# Patient Record
Sex: Female | Born: 1945 | Hispanic: No | Marital: Single | Smoking: Never smoker
Health system: Southern US, Community
[De-identification: ages and names within clinical notes are randomized; demographics above are authoritative.]

## PROBLEM LIST (undated history)

## (undated) DIAGNOSIS — E119 Type 2 diabetes mellitus without complications: Secondary | ICD-10-CM

---

## 2017-10-02 ENCOUNTER — Emergency Department (HOSPITAL_COMMUNITY): Admission: EM | Admit: 2017-10-02 | Discharge: 2017-10-02 | Discharge status: Against Medical Advice | Payer: Self-pay

## 2017-10-02 ENCOUNTER — Other Ambulatory Visit: Payer: Self-pay

## 2017-10-03 ENCOUNTER — Emergency Department (HOSPITAL_COMMUNITY): Payer: Self-pay

## 2017-10-03 ENCOUNTER — Other Ambulatory Visit: Payer: Self-pay

## 2017-10-03 ENCOUNTER — Inpatient Hospital Stay (HOSPITAL_COMMUNITY)
Admission: EM | Admit: 2017-10-03 | Discharge: 2017-10-06 | DRG: 638 | Disposition: A | Discharge status: Home / Self Care | Payer: Self-pay | Attending: Internal Medicine | Admitting: Internal Medicine

## 2017-10-03 ENCOUNTER — Encounter (HOSPITAL_COMMUNITY): Payer: Self-pay | Admitting: Emergency Medicine

## 2017-10-03 DIAGNOSIS — E871 Hypo-osmolality and hyponatremia: Secondary | ICD-10-CM | POA: Diagnosis present

## 2017-10-03 DIAGNOSIS — E119 Type 2 diabetes mellitus without complications: Secondary | ICD-10-CM

## 2017-10-03 DIAGNOSIS — E860 Dehydration: Secondary | ICD-10-CM | POA: Diagnosis present

## 2017-10-03 DIAGNOSIS — E1110 Type 2 diabetes mellitus with ketoacidosis without coma: Principal | ICD-10-CM | POA: Diagnosis present

## 2017-10-03 DIAGNOSIS — L97509 Non-pressure chronic ulcer of other part of unspecified foot with unspecified severity: Secondary | ICD-10-CM

## 2017-10-03 DIAGNOSIS — I10 Essential (primary) hypertension: Secondary | ICD-10-CM | POA: Diagnosis present

## 2017-10-03 DIAGNOSIS — R739 Hyperglycemia, unspecified: Secondary | ICD-10-CM | POA: Diagnosis present

## 2017-10-03 DIAGNOSIS — L03116 Cellulitis of left lower limb: Secondary | ICD-10-CM | POA: Diagnosis present

## 2017-10-03 DIAGNOSIS — E11621 Type 2 diabetes mellitus with foot ulcer: Secondary | ICD-10-CM | POA: Diagnosis present

## 2017-10-03 DIAGNOSIS — Z9114 Patient's other noncompliance with medication regimen: Secondary | ICD-10-CM

## 2017-10-03 DIAGNOSIS — L97529 Non-pressure chronic ulcer of other part of left foot with unspecified severity: Secondary | ICD-10-CM | POA: Diagnosis present

## 2017-10-03 DIAGNOSIS — E11610 Type 2 diabetes mellitus with diabetic neuropathic arthropathy: Secondary | ICD-10-CM | POA: Diagnosis present

## 2017-10-03 DIAGNOSIS — E1142 Type 2 diabetes mellitus with diabetic polyneuropathy: Secondary | ICD-10-CM | POA: Diagnosis present

## 2017-10-03 HISTORY — DX: Type 2 diabetes mellitus without complications: E11.9

## 2017-10-03 LAB — COMPREHENSIVE METABOLIC PANEL
ALBUMIN: 3.3 g/dL — AB (ref 3.5–5.0)
ALK PHOS: 121 U/L (ref 38–126)
ALT: 17 U/L (ref 14–54)
AST: 23 U/L (ref 15–41)
Anion gap: 11 (ref 5–15)
BUN: 18 mg/dL (ref 6–20)
CALCIUM: 9.5 mg/dL (ref 8.9–10.3)
CHLORIDE: 93 mmol/L — AB (ref 101–111)
CO2: 23 mmol/L (ref 22–32)
CREATININE: 0.8 mg/dL (ref 0.44–1.00)
GFR calc non Af Amer: >60 mL/min (ref >60)
GLUCOSE: 568 mg/dL — AB (ref 65–99)
Potassium: 5 mmol/L (ref 3.5–5.1)
SODIUM: 127 mmol/L — AB (ref 135–145)
Total Bilirubin: 0.9 mg/dL (ref 0.3–1.2)
Total Protein: 7.5 g/dL (ref 6.5–8.1)

## 2017-10-03 LAB — URINALYSIS, ROUTINE W REFLEX MICROSCOPIC
Bilirubin Urine: NEGATIVE
Ketones, ur: NEGATIVE mg/dL
NITRITE: NEGATIVE
PROTEIN: NEGATIVE mg/dL
Specific Gravity, Urine: 1.021 (ref 1.005–1.030)
pH: 6 (ref 5.0–8.0)

## 2017-10-03 LAB — CBC WITH DIFFERENTIAL/PLATELET
BASOS PCT: 1 %
Basophils Absolute: 0 10*3/uL (ref 0.0–0.1)
EOS ABS: 0.1 10*3/uL (ref 0.0–0.7)
Eosinophils Relative: 2 %
HCT: 40.8 % (ref 36.0–46.0)
HEMOGLOBIN: 14.7 g/dL (ref 12.0–15.0)
Lymphocytes Relative: 26 %
Lymphs Abs: 1.6 10*3/uL (ref 0.7–4.0)
MCH: 31.6 pg (ref 26.0–34.0)
MCHC: 36 g/dL (ref 30.0–36.0)
MCV: 87.7 fL (ref 78.0–100.0)
MONO ABS: 0.4 10*3/uL (ref 0.1–1.0)
MONOS PCT: 6 %
NEUTROS PCT: 65 %
Neutro Abs: 4 10*3/uL (ref 1.7–7.7)
Platelets: 194 10*3/uL (ref 150–400)
RBC: 4.65 MIL/uL (ref 3.87–5.11)
RDW: 12.1 % (ref 11.5–15.5)
WBC: 6.2 10*3/uL (ref 4.0–10.5)

## 2017-10-03 LAB — HEMOGLOBIN A1C
Hgb A1c MFr Bld: 12.7 % — ABNORMAL HIGH (ref 4.8–5.6)
MEAN PLASMA GLUCOSE: 317.79 mg/dL

## 2017-10-03 LAB — I-STAT CG4 LACTIC ACID, ED: Lactic Acid, Venous: 2.15 mmol/L (ref 0.5–1.9)

## 2017-10-03 LAB — GLUCOSE, CAPILLARY: Glucose-Capillary: 135 mg/dL — ABNORMAL HIGH (ref 65–99)

## 2017-10-03 LAB — LACTIC ACID, PLASMA: Lactic Acid, Venous: 1.2 mmol/L (ref 0.5–1.9)

## 2017-10-03 LAB — CBG MONITORING, ED
GLUCOSE-CAPILLARY: 548 mg/dL — AB (ref 65–99)
GLUCOSE-CAPILLARY: 336 mg/dL — AB (ref 65–99)
Glucose-Capillary: 180 mg/dL — ABNORMAL HIGH (ref 65–99)
Glucose-Capillary: 238 mg/dL — ABNORMAL HIGH (ref 65–99)

## 2017-10-03 MED ORDER — LISINOPRIL 2.5 MG PO TABS
2.5000 mg | ORAL_TABLET | Freq: Every day | ORAL | Status: DC
  Administered 2017-10-03 – 2017-10-06 (×4): 2.5 mg via ORAL
  Filled 2017-10-03 (×4): qty 1

## 2017-10-03 MED ORDER — SODIUM CHLORIDE 0.9 % IV SOLN: INTRAVENOUS | Status: DC

## 2017-10-03 MED ORDER — POTASSIUM CHLORIDE 10 MEQ/100ML IV SOLN: 10.0000 meq | INTRAVENOUS | Status: DC

## 2017-10-03 MED ORDER — INSULIN ASPART 100 UNIT/ML ~~LOC~~ SOLN
0.0000 [IU] | Freq: Three times a day (TID) | SUBCUTANEOUS | Status: DC
  Administered 2017-10-03 – 2017-10-04 (×2): 3 [IU] via SUBCUTANEOUS
  Administered 2017-10-04: 5 [IU] via SUBCUTANEOUS
  Administered 2017-10-04: 8 [IU] via SUBCUTANEOUS
  Administered 2017-10-05: 15 [IU] via SUBCUTANEOUS
  Administered 2017-10-05 – 2017-10-06 (×3): 8 [IU] via SUBCUTANEOUS
  Filled 2017-10-03: qty 1

## 2017-10-03 MED ORDER — DEXTROSE-NACL 5-0.45 % IV SOLN: INTRAVENOUS | Status: DC

## 2017-10-03 MED ORDER — INSULIN REGULAR HUMAN 100 UNIT/ML IJ SOLN
INTRAMUSCULAR | Status: DC
  Filled 2017-10-03: qty 1

## 2017-10-03 MED ORDER — ENOXAPARIN SODIUM 40 MG/0.4ML ~~LOC~~ SOLN
40.0000 mg | SUBCUTANEOUS | Status: DC
  Administered 2017-10-03 – 2017-10-05 (×3): 40 mg via SUBCUTANEOUS
  Filled 2017-10-03 (×3): qty 0.4

## 2017-10-03 MED ORDER — SODIUM CHLORIDE 0.9 % IV SOLN
INTRAVENOUS | Status: AC
  Administered 2017-10-03 – 2017-10-04 (×2): via INTRAVENOUS

## 2017-10-03 MED ORDER — SODIUM CHLORIDE 0.9 % IV SOLN
INTRAVENOUS | Status: DC
  Filled 2017-10-03: qty 1

## 2017-10-03 MED ORDER — INSULIN ASPART 100 UNIT/ML ~~LOC~~ SOLN
20.0000 [IU] | Freq: Once | SUBCUTANEOUS | Status: AC
  Administered 2017-10-03: 20 [IU] via SUBCUTANEOUS
  Filled 2017-10-03: qty 1

## 2017-10-03 MED ORDER — DEXTROSE 50 % IV SOLN: 25.0000 mL | INTRAVENOUS | Status: DC | PRN

## 2017-10-03 MED ORDER — INSULIN ASPART 100 UNIT/ML ~~LOC~~ SOLN
0.0000 [IU] | Freq: Every day | SUBCUTANEOUS | Status: DC
  Administered 2017-10-04: 3 [IU] via SUBCUTANEOUS
  Administered 2017-10-05: 4 [IU] via SUBCUTANEOUS

## 2017-10-03 NOTE — ED Triage Notes (Signed)
Patient has diabetes, open wound on bottom of left foot. Patient states she only feels the pain from the wound sometimes. 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 No other complaints at this time.

## 2017-10-03 NOTE — ED Notes (Signed)
2 lines started and NS running in wide open

## 2017-10-03 NOTE — ED Notes (Signed)
Patient transported to X-ray 

## 2017-10-03 NOTE — H&P (Signed)
History and Physical    Stacey Duffy ZOX:[id number] DOB: 01-16-1946 DOA: 10/03/2017  PCP: Patient, No Pcp Per Patient coming from: home  Chief Complaint: generalized weakness  HPI: Stacey Duffy is a 72 y.o. female with medical history significant diabetes presents to the emergency Department chief complaint ongoing generalized weakness intermittent dizziness. Initial evaluation reveals hyperglycemia. [facility] are asked to admit  Information is obtained from the grandson who translates for the patient. Of note grandson seems to have little information and not completely willing to ask all questions during exam. He reports patient came to this country 5 months ago. Patient knows she is a diabetic and at home she took pills. She brought no medicines with her to this country. The last couple of days she has experienced generalized weakness intermittent dizziness. She denies headache syncope or near-syncope. She denies chest pain palpitation shortness of breath. She denies nausea vomiting diarrhea constipation bright red blood per rectum or melena. She denies dysuria hematuria frequency or urgency. She does report she has an ongoing nonhealing foot ulcer at the bottom of her left foot. She denies a fever chills.   ED Course: in the emergency department she is afebrile hemodynamically stable and not hypoxic. He is provided with IV fluids  Review of Systems: As per HPI otherwise all other systems reviewed and are negative.   Ambulatory Status:ambulates independently  Past Medical History:  Diagnosis Date  . Diabetes mellitus without complication (HCC)     History reviewed. No pertinent surgical history.  Social History   Socioeconomic History  . Marital status: Single    Spouse name: Not on file  . Number of children: Not on file  . Years of education: Not on file  . Highest education level: Not on file  Occupational History  . Not on file  Social Needs  . Financial  resource strain: Not on file  . Food insecurity:    Worry: Not on file    Inability: Not on file  . Transportation needs:    Medical: Not on file    Non-medical: Not on file  Tobacco Use  . Smoking status: Not on file  Substance and Sexual Activity  . Alcohol use: Not on file  . Drug use: Not on file  . Sexual activity: Not on file  Lifestyle  . Physical activity:    Days per week: Not on file    Minutes per session: Not on file  . Stress: Not on file  Relationships  . Social connections:    Talks on phone: Not on file    Gets together: Not on file    Attends religious service: Not on file    Active member of club or organization: Not on file    Attends meetings of clubs or organizations: Not on file    Relationship status: Not on file  . Intimate partner violence:    Fear of current or ex partner: Not on file    Emotionally abused: Not on file    Physically abused: Not on file    Forced sexual activity: Not on file  Other Topics Concern  . Not on file  Social History Narrative  . Not on file    No Known Allergies  No family history on file. unable to determine  Prior to Admission medications   Medication Sig Start Date End Date Taking? Authorizing Provider  Famotidine (ACID CONTROLLER PO) Take 1 tablet by mouth daily.   Yes [provider]  Physical Exam: Vitals:   10/03/17 1430 10/03/17 1500 10/03/17 1600 10/03/17 1615  BP: (!) 143/73 (!) 151/71 (!) 159/79 (!) 146/81  Pulse: 74 82 78 80  Resp: 15 19 13 12   Temp:      TempSrc:      SpO2: 99% 98% 97% 97%     General:  Appears calm and comfortable smiling n no acute distress Eyes:  PERRL, EOMI, normal lids, iris ENT:  grossly normal hearing, lips & tongue, mucous membranes of her mouth are pink somewhat dry Neck:  no LAD, masses or thyromegaly Cardiovascular:  RRR, no m/r/g. No LE edema.  Respiratory:  CTA bilaterally, no w/r/r. Normal respiratory effort. Abdomen:  soft, ntnd, Rozella bowel  sounds but somewhat sluggish no guarding or rebounding Skin:  no rash or induration seen on limited exam open wound on bottom of left foot mid plantar surface. No drainage no odor. No swelling or erythema Musculoskeletal:  grossly normal tone BUE/BLE, good ROM, no bony abnormality Psychiatric:  grossly normal mood and affect, speech fluent and appropriate, AOx3 Neurologic:  CN 2-12 grossly intact, moves all extremities in coordinated fashion, sensation intact  Labs on Admission: I have personally reviewed following labs and imaging studies  CBC: Recent Labs  Lab 10/03/17 1215  WBC 6.2  NEUTROABS 4.0  HGB 14.7  HCT 40.8  MCV 87.7  PLT 194   Basic Metabolic Panel: Recent Labs  Lab 10/03/17 1215  NA 127*  K 5.0  CL 93*  CO2 23  GLUCOSE 568*  BUN 18  CREATININE 0.80  CALCIUM 9.5   GFR: CrCl cannot be calculated (Unknown ideal weight.). Liver Function Tests: Recent Labs  Lab 10/03/17 1215  AST 23  ALT 17  ALKPHOS 121  BILITOT 0.9  PROT 7.5  ALBUMIN 3.3*   No results for input(s): LIPASE, AMYLASE in the last 168 hours. No results for input(s): AMMONIA in the last 168 hours. Coagulation Profile: No results for input(s): INR, PROTIME in the last 168 hours. Cardiac Enzymes: No results for input(s): CKTOTAL, CKMB, CKMBINDEX, TROPONINI in the last 168 hours. BNP (last 3 results) No results for input(s): PROBNP in the last 8760 hours. HbA1C: No results for input(s): HGBA1C in the last 72 hours. CBG: Recent Labs  Lab 10/03/17 1203 10/03/17 1536  GLUCAP 548* 336*   Lipid Profile: No results for input(s): CHOL, HDL, LDLCALC, TRIG, CHOLHDL, LDLDIRECT in the last 72 hours. Thyroid Function Tests: No results for input(s): TSH, T4TOTAL, FREET4, T3FREE, THYROIDAB in the last 72 hours. Anemia Panel: No results for input(s): VITAMINB12, FOLATE, FERRITIN, TIBC, IRON, RETICCTPCT in the last 72 hours. Urine analysis: No results found for: COLORURINE, APPEARANCEUR, LABSPEC,  PHURINE, GLUCOSEU, HGBUR, BILIRUBINUR, KETONESUR, PROTEINUR, UROBILINOGEN, NITRITE, LEUKOCYTESUR  Creatinine Clearance: CrCl cannot be calculated (Unknown ideal weight.).  Sepsis Labs: @LABRCNTIP (procalcitonin:4,lacticidven:4) )No results found for this or any previous visit (from the past 240 hour(s)).   Radiological Exams on Admission: Dg Foot Complete Left  Result Date: 10/03/2017 CLINICAL DATA:  Diabetic ulcer plantar surface of the foot. EXAM: LEFT FOOT - COMPLETE 3+ VIEW COMPARISON:  No recent prior. FINDINGS: No acute bony or joint abnormality. No evidence of fracture dislocation. Ulceration noted along the plantar aspect of the midportion of the left foot. No radiopaque foreign body. Peripheral vascular calcification IMPRESSION: . Prominent soft tissue ulceration of the midportion of the plantar aspect of the left foot. No radiopaque foreign body. No focal acute bony abnormality. 2.  Diffuse degenerative change. Electronically Signed  ByMaisie Fus: Thomas  Register   On: 10/03/2017 13:43    EKG:   Assessment/Plan Principal Problem:   Hyperglycemia Active Problems:   Diabetes (HCC)   Foot ulcer (HCC)   Hyponatremia   #1. Hyperglycemia. Serum glucose 518 on presentation. Gap 17 corrected, CO2 23. Secondary to noncompliance. Patient reports she takes oral agents in her native land. She's been visiting here for 5 months and did not bring her home medications with her.  At the time of admission CBG 336.  -Admit to telemetry -Obtain a hemoglobin A1c -continue vigorous IV fluids -carb modified diet -Sliding scale insulin for optimal control -Diabetes coordinator  #2. Hyponatremia. Likely related to above. Sodium 127. Potassium 5.0. She received at least 2-1/2 L in the emergency department. -Continue vigorous IV fluids -Recheck later this evening -Monitor closely  #3. Diabetes. Reportedly she was diagnosed and is on oral agents but she [place] them [place]  came 5 [place] ago -Diabetes coordinator to assist with discharge planning -See #1  #4. Diabetic foot ulcer. patient has an open wound on the bottom of her left foot. There is no erythema swelling drainage odor. Family reports she's had a "long time". Patient is afebrile hemodynamically stable no leukocytosis she is nontoxic appearing but of course some concern for osteomyelitis -plain x-rays -wound consult -will hold off on antibiotics until the results of x-ray -May need to get an MRI depending on results of x-ray  #5. Hypertension. Blood pressure the high end of normal. -Low-dose lisinopril -Monitor   DVT prophylaxis: lovenox  Code Status: full  Family Communication: grandson  Disposition Plan: home  Consults called: diabetes coordinator  Admission status: inpatient    Gwenyth BenderBLACK,KAREN M MD [facility]  If 7PM-7AM, please contact night-coverage [email or web address] Password [email or web address]  10/03/2017, 4:37 PM

## 2017-10-03 NOTE — ED Triage Notes (Signed)
Patient unsure if she has other medical problems than "the sugar".

## 2017-10-03 NOTE — ED Notes (Signed)
Contacted admitting doctor for DKA order set, instructed to hold off on glucostabilizer.

## 2017-10-03 NOTE — ED Provider Notes (Signed)
[facility]   CSN: [id number] Arrival date & time: 10/03/17  1050     History   Chief Complaint Chief Complaint  Patient presents with  . Skin Ulcer    HPI Markus DaftLenora Knickerbocker is a 72 y.o. female.  HPI Notes with family members who assist with the HPI. [place] speaks in [place] dialect, but family members translate and she prefers this. The present not long after their arrival here, due to concern of ongoing weakness, dizziness, occasional vision changes, and fatigue as well as a nonhealing left foot ulcer. Patient acknowledges a history of heartburn, seemingly denies history of diabetes, but 1 of the family member suggests that she does have this diagnosis. Regardless, the patient is not taking insulin currently, nor any other medication regularly. Onset of this particular illness is unclear, but it seems as though over the past day or 2 the weakness, dizziness has increased. No focal chest pain, no abdominal pain, no foot pain, though she has diminished sensation in both feet. They deny new drainage, erythema around the edge of her left foot wound. Patient has not seen a physician since arriving here.  Past Medical History:  Diagnosis Date  . Diabetes mellitus without complication (HCC)     There are no active problems to display for this patient.   History reviewed. No pertinent surgical history.   OB History   None      Home Medications    Prior to Admission medications   Not on File    Family History No family history on file.  Social History Social History   Tobacco Use  . Smoking status: Not on file  Substance Use Topics  . Alcohol use: Not on file  . Drug use: Not on file     Allergies   Patient has no known allergies.   Review of Systems Review of Systems  Constitutional:       Per HPI, otherwise negative  HENT:       Per HPI, otherwise negative  Respiratory:   Per HPI, otherwise negative  Cardiovascular:       Per HPI, otherwise negative  Gastrointestinal: Negative for vomiting.  Endocrine:       Negative aside from HPI  Genitourinary:       Neg aside from HPI   Musculoskeletal:       Per HPI, otherwise negative  Skin: Positive for wound.  Neurological: Positive for dizziness and light-headedness. Negative for syncope.     Physical Exam Updated Vital Signs BP (!) 149/76 (BP Location: Right Arm)   Pulse 80   Temp 98.2 F (36.8 C) (Oral)   Resp 18   SpO2 98%   Physical Exam  Constitutional: She is oriented to person, place, and time. No distress.  Obese elderly female speaking with her family members without apparent respiratory difficulty  HENT:  Head: Normocephalic and atraumatic.  Eyes: Conjunctivae and EOM are normal.  Cardiovascular: Normal rate and regular rhythm.  Pulmonary/Chest: Effort normal and breath sounds normal. No stridor. No respiratory distress.  Abdominal: She exhibits no distension.  Musculoskeletal: She exhibits no edema.  Neurological: She is alert and oriented to person, place, and time. No cranial nerve deficit.  She states that she cannot feel her toes on the affected side, though she can move them bilaterally. Neurologic exam otherwise unremarkable.  Skin: Skin is warm and dry.     Psychiatric: She has a normal mood and affect.  Nursing note and vitals reviewed.    ED Treatments / Results  Labs (all labs ordered are listed, but only abnormal results are displayed) Labs Reviewed  COMPREHENSIVE METABOLIC PANEL - Abnormal; Notable for the following components:      Result Value   Sodium 127 (*)    Chloride 93 (*)    Glucose, Bld 568 (*)    Albumin 3.3 (*)    All other components within normal limits  I-STAT CG4 LACTIC ACID, ED - Abnormal; Notable for the following components:   Lactic Acid, Venous 2.15 (*)    All other components within normal limits  CBG MONITORING, ED - Abnormal; Notable for  the following components:   Glucose-Capillary 548 (*)    All other components within normal limits  CBC WITH DIFFERENTIAL/PLATELET  URINALYSIS, ROUTINE W REFLEX MICROSCOPIC  I-STAT CG4 LACTIC ACID, ED    EKG None  Radiology Dg Foot Complete Left  Result Date: 10/03/2017 CLINICAL DATA:  Diabetic ulcer plantar surface of the foot. EXAM: LEFT FOOT - COMPLETE 3+ VIEW COMPARISON:  No recent prior. FINDINGS: No acute bony or joint abnormality. No evidence of fracture dislocation. Ulceration noted along the plantar aspect of the midportion of the left foot. No radiopaque foreign body. Peripheral vascular calcification IMPRESSION: . Prominent soft tissue ulceration of the midportion of the plantar aspect of the left foot. No radiopaque foreign body. No focal acute bony abnormality. 2.  Diffuse degenerative change. Electronically Signed   By: Maisie Fus  Register   On: 10/03/2017 13:43    Procedures Procedures (including critical care time)  Medications Ordered in ED Medications  0.9 %  sodium chloride infusion (has no administration in time range)  dextrose 5 %-0.45 % sodium chloride infusion (has no administration in time range)  insulin regular (NOVOLIN R,HUMULIN R) 100 Units in sodium chloride 0.9 % 100 mL (1 Units/mL) infusion (has no administration in time range)  dextrose 50 % solution 25 mL (has no administration in time range)     Initial Impression / Assessment and Plan / ED Course  I have reviewed the triage vital signs and the nursing notes.  Pertinent labs & imaging results that were available during my care of the patient were reviewed by me and considered in my medical decision making (see chart for details).  This obese elderly female from [place] presents due to concern of weakness, dizziness, lightheadedness. Patient has substantial hyperglycemia, and after correction of her electrolytes does not have anion gap of 17. Patient likely has history of diabetes, and this is  consistent with today's evaluation, with hyperglycemia, anion gap, acidosis, with lactic greater than 2. Patient received empiric IV fluids, is starting an insulin drip. Patient's foot evaluation is consistent with chronic foot wound, absence of bleeding, drainage, discharge somewhat reassuring, but given the history of diabetes, neuropathy, x-ray was performed. This did not demonstrate osseous lesion. However, the patient would benefit from wound care while in the hospital.   Update:, Glucose has diminished. Initial hemoglobin A1c 12.7, consistent with long-term poorly controlled diabetes.  Update:, Patient has begun receiving fluids, is awaiting insulin drip from pharmacy, remains in similar condition. Given the concern for diabetic ketoacidosis, nonhealing foot ulcer, and ongoing insulin drip, the patient will require admission for further evaluation and management.    Final Clinical Impressions(s) / ED Diagnoses  Diabetic ketoacidosis Nonhealing foot wound  CRITICAL CARE Performed by: Gerhard Munch Total critical care time: 40 minutes Critical care time was exclusive of separately billable procedures and  treating other patients. Critical care was necessary to treat or prevent imminent or life-threatening deterioration. Critical care was time spent personally by me on the following activities: development of treatment plan with patient and/or surrogate as well as nursing, discussions with consultants, evaluation of patient's response to treatment, examination of patient, obtaining history from patient or surrogate, ordering and performing treatments and interventions, ordering and review of laboratory studies, ordering and review of radiographic studies, pulse oximetry and re-evaluation of patient's condition.    Gerhard Munch, MD 10/03/17 [phone]

## 2017-10-03 NOTE — ED Notes (Addendum)
Pt reports blurry vision. Family at bedside; from [place]  and has run out of pain medicine.

## 2017-10-03 NOTE — ED Notes (Signed)
Holley the nurse was notified that patient has a positive lactic Acid 2.15

## 2017-10-04 ENCOUNTER — Inpatient Hospital Stay (HOSPITAL_COMMUNITY): Payer: Self-pay

## 2017-10-04 LAB — CBC
HEMATOCRIT: 36.3 % (ref 36.0–46.0)
Hemoglobin: 12.5 g/dL (ref 12.0–15.0)
MCH: 30 pg (ref 26.0–34.0)
MCHC: 34.4 g/dL (ref 30.0–36.0)
MCV: 87.3 fL (ref 78.0–100.0)
PLATELETS: 180 10*3/uL (ref 150–400)
RBC: 4.16 MIL/uL (ref 3.87–5.11)
RDW: 12.2 % (ref 11.5–15.5)
WBC: 5.5 10*3/uL (ref 4.0–10.5)

## 2017-10-04 LAB — BASIC METABOLIC PANEL
Anion gap: 7 (ref 5–15)
BUN: 10 mg/dL (ref 6–20)
CHLORIDE: 108 mmol/L (ref 101–111)
CO2: 23 mmol/L (ref 22–32)
CREATININE: 0.63 mg/dL (ref 0.44–1.00)
Calcium: 8 mg/dL — ABNORMAL LOW (ref 8.9–10.3)
GFR calc non Af Amer: >60 mL/min (ref >60)
Glucose, Bld: 186 mg/dL — ABNORMAL HIGH (ref 65–99)
POTASSIUM: 3.4 mmol/L — AB (ref 3.5–5.1)
Sodium: 138 mmol/L (ref 135–145)

## 2017-10-04 LAB — GLUCOSE, CAPILLARY
GLUCOSE-CAPILLARY: 223 mg/dL — AB (ref 65–99)
GLUCOSE-CAPILLARY: 195 mg/dL — AB (ref 65–99)
GLUCOSE-CAPILLARY: 289 mg/dL — AB (ref 65–99)
Glucose-Capillary: 258 mg/dL — ABNORMAL HIGH (ref 65–99)

## 2017-10-04 MED ORDER — MUPIROCIN 2 % EX OINT
TOPICAL_OINTMENT | Freq: Every day | CUTANEOUS | Status: DC
  Administered 2017-10-04 – 2017-10-06 (×3): via TOPICAL
  Filled 2017-10-04 (×3): qty 22

## 2017-10-04 MED ORDER — VANCOMYCIN HCL 10 G IV SOLR
1500.0000 mg | Freq: Once | INTRAVENOUS | Status: AC
  Administered 2017-10-04: 1500 mg via INTRAVENOUS
  Filled 2017-10-04: qty 1500

## 2017-10-04 MED ORDER — PIPERACILLIN-TAZOBACTAM 3.375 G IVPB
3.3750 g | Freq: Three times a day (TID) | INTRAVENOUS | Status: DC
  Administered 2017-10-04 – 2017-10-06 (×6): 3.375 g via INTRAVENOUS
  Filled 2017-10-04 (×7): qty 50

## 2017-10-04 MED ORDER — VANCOMYCIN HCL IN DEXTROSE 1-5 GM/200ML-% IV SOLN: 1000.0000 mg | Freq: Once | INTRAVENOUS | Status: DC

## 2017-10-04 MED ORDER — LIVING WELL WITH DIABETES BOOK
Freq: Once | Status: AC
  Administered 2017-10-04: 18:00:00
  Filled 2017-10-04: qty 1

## 2017-10-04 MED ORDER — VANCOMYCIN HCL 10 G IV SOLR
1250.0000 mg | INTRAVENOUS | Status: DC
  Administered 2017-10-05: 1250 mg via INTRAVENOUS
  Filled 2017-10-04 (×2): qty 1250

## 2017-10-04 MED ORDER — INSULIN GLARGINE 100 UNIT/ML ~~LOC~~ SOLN
15.0000 [IU] | Freq: Every day | SUBCUTANEOUS | Status: DC
  Administered 2017-10-04 – 2017-10-05 (×2): 15 [IU] via SUBCUTANEOUS
  Filled 2017-10-04 (×2): qty 0.15

## 2017-10-04 MED ORDER — INSULIN STARTER KIT- SYRINGES (ENGLISH)
1.0000 | Freq: Once | Status: DC
  Filled 2017-10-04 (×2): qty 1

## 2017-10-04 NOTE — Progress Notes (Addendum)
Inpatient Diabetes Program Recommendations  AACE/ADA: New Consensus Statement on Inpatient Glycemic Control (2015)  Target Ranges:  Prepandial:   less than 140 mg/dL      Peak postprandial:   less than 180 mg/dL (1-2 hours)      Critically ill patients:  140 - 180 mg/dL   Lab Results  Component Value Date   GLUCAP 195 (H) 10/04/2017   HGBA1C 12.7 (H) 10/03/2017    Review of Glycemic Control Results for Stacey Duffy, Stacey Duffy (MRN [id number]) as of 10/04/2017 12:21  Ref. Range 10/03/2017 17:50 10/03/2017 18:27 10/03/2017 22:14 10/04/2017 07:41 10/04/2017 11:41  Glucose-Capillary Latest Ref Range: 65 - 99 mg/dL 238 (H) 180 (H) 135 (H) 223 (H) 195 (H)   Diabetes history: DM2 Outpatient Diabetes medications: No DM meds over the past 5 months Current orders for Inpatient glycemic control: Novolog correction moderate tid + hs  Inpatient Diabetes Program Recommendations:   Will plan to speak to patient regarding diabetes. -Lantus 15 units qd (78.4 kg x 0.2 units/kg = 16 units)   On discharge, Relion Novolin 70/30 insulin would be more afffordable  70/30 11 units bid would = approx. 15.4 basal + 6.6 units meal coverage U 100 syringes  (if dose <30) (#[id number])  2:15 Met with patient and son @ bedside and did education on insulin syringe administration. Son returned successful demonstration of saline with syringe along with drawing up saline. Patient is not able to visually see the numbers on the syringe but son states someone will be home with her to be able to administer am and pm ac meals. Also if patient needs to give injection and no one is available, can draw up insulin ahead of time and keep in refrigerator for patient to give. Patient did successful administration of saline into practice site. Nurses, please assist patient and son to review patient education videos and allow patient to give own injections and prick her finger for CBGs. Gave son information regarding Relion glucometer with strips,  lancets, and syringes. Ordered starter kit of syringes and dietician consult. Patient is used to eating high carbohydrate fruits and vegetables.  Thank you, Nani Gasser. Laelani Vasko, RN, MSN, CDE  Diabetes Coordinator Inpatient Glycemic Control Team Team Pager [phone] (8am-5pm) 10/04/2017 12:28 PM

## 2017-10-04 NOTE — Progress Notes (Signed)
Pharmacy Antibiotic Note  Markus DaftLenora Fernicola is a 72 y.o. female admitted on 10/03/2017 with a DFI and cellulitis. MRI negative for osteo. Pharmacy has been consulted for Vancomycin and Zosyn dosing.  Pseudomonas coverage may not be warranted and could consider utilizing Rocephin + Flagyl in place of Zosyn. Discussed with MD who wanted to keep Zosyn today.  Plan: 1. Vancomycin 1500 mg IV x 1 followed by 1250 mg IV every 24 hours 2. Start Zosyn 3.375g IV every 8 hours 3. Will continue to follow renal function, culture results, LOT, and antibiotic de-escalation plans   Weight: 172 lb 13.5 oz (78.4 kg)  Temp (24hrs), Avg:97.9 F (36.6 C), Min:97.8 F (36.6 C), Max:98.1 F (36.7 C)  Recent Labs  Lab 10/03/17 1215 10/03/17 1234 10/03/17 1536 10/04/17 0221  WBC 6.2  --   --  5.5  CREATININE 0.80  --   --  0.63  LATICACIDVEN  --  2.15* 1.2  --     CrCl cannot be calculated (Unknown ideal weight.).    No Known Allergies  Antimicrobials this admission: Vanc 4/17 >> Zosyn 4/17 >>  Dose adjustments this admission: n/a  Microbiology results: 4/17 BCx >> 4/17 WCx >>  Thank you for allowing pharmacy to be a part of this patient's care.  Georgina PillionElizabeth Kadence Mikkelson, PharmD, BCPS Clinical Pharmacist Pager: [phone] Clinical phone for 10/04/2017 from 7a-3:30p: [phone] If after 3:30p, please call main pharmacy at: [phone] 10/04/2017 1:46 PM

## 2017-10-04 NOTE — Consult Note (Addendum)
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  Poorly controlled diabetes.  Wound type:Neuropathic nonhealing wound Pressure Injury POA: NA Measurement: 1 cm x 2.1 cm x 1 cm with devitalized tissue to wound bed and circumferential callous.  Musty odor Wound ZOX:[id number]:devitalized tissue Drainage (amount, consistency, odor) moderate serosanguinous  Musty odor Periwound:callous Dressing procedure/placement/frequency:Cleanse wound to left plantar foot with NS.  Apply mupirocin ointment to wound bed.  Cover with 4x4, kerlix and tape.  Change daily.  Will not follow at this time.  Please re-consult if needed.  Maple HudsonKaren Marshae Azam RN BSN CWON Pager [phone]

## 2017-10-04 NOTE — Progress Notes (Signed)
Pt taken to MRI via bed.  Son accompanied pt down to MRI.  Alert.  No s/s of distress at this time. Respirations even and unlabored.

## 2017-10-04 NOTE — Progress Notes (Signed)
PROGRESS NOTE    Stacey Duffy  ZOX:[id number] DOB: [birth year] DOA: 10/03/2017 PCP: Patient, No Pcp Per   Brief Narrative:  72 year old female with history of diabetes mellitus type 2 but not taking any medications came to the hospital with complains of intermittent dizziness.  Patient has immigrated here from [place] about 5 months ago and did not bring any of her medications here.  During this time she has continued to have increasing urine output but did not check her blood sugars.  Over the last couple of days her symptoms worsen therefore came to the ER.  In the ER she was noted to have left foot nonhealing ulcer with some drainage and erythema around the site.  She was also noted to be hyperglycemic without anion gap acidosis therefore patient was admitted for further care management to the hospital   Assessment & Plan:   Principal Problem:   Hyperglycemia Active Problems:   Diabetes (HCC)   Foot ulcer (HCC)   Hyponatremia  Infected diabetic left foot ulcer with surrounding cellulitis Purulent cellulitis left foot -Some foul-smelling drainage therefore will order cultures.  X-ray of the foot shows soft tissue swelling, MRI is negative for osteomyelitis but shows deep tissue infection -Patient seen by wound care this morning, routine dressing down -We will consult podiatry to see if debridement is needed -In the meantime will start patient on vancomycin and Zosyn  Uncontrolled diabetes mellitus type 1 -Diabetic coordinator has been consulted, diabetic diet ordered - Patient started on sliding scale and Lantus 15 units -On lisinopril 2.5 mg daily  DVT prophylaxis: Lovenox Code Status: Full code Family Communication: Son at bedside Disposition Plan: To be determined  Consultants:   Podiatry  Procedures:   None  Antimicrobials:   Vancomycin 4/17  Zosyn 4/17   Subjective: No complaints this morning.  Remains afebrile.  Dizziness is better with IV  fluids  Review of Systems Otherwise negative except as per HPI, including: General: Denies fever, chills, night sweats or unintended weight loss. Resp: Denies cough, wheezing, shortness of breath. Cardiac: Denies chest pain, palpitations, orthopnea, paroxysmal nocturnal dyspnea. GI: Denies abdominal pain, nausea, vomiting, diarrhea or constipation GU: Denies dysuria, frequency, hesitancy or incontinence MS: Left foot pain Neuro: Denies headache, neurologic deficits (focal weakness, numbness, tingling), abnormal gait Psych: Denies anxiety, depression, SI/HI/AVH Skin: Denies new rashes or lesions ID: Denies sick contacts, exotic exposures, travel  Objective: Vitals:   10/03/17 2018 10/04/17 0123 10/04/17 0500 10/04/17 0739  BP: (!) 143/68 125/73 (!) 142/68 127/63  Pulse: 74 71 81 73  Resp: 19 20 16 14   Temp: 98 F (36.7 C) 98.1 F (36.7 C) 97.8 F (36.6 C) 97.8 F (36.6 C)  TempSrc: Oral Oral Oral Oral  SpO2: 99% 96% 98% 98%  Weight: 78.4 kg (172 lb 13.5 oz)       Intake/Output Summary (Last 24 hours) at 10/04/2017 1330 Last data filed at 10/04/2017 0000 Gross per 24 hour  Intake 2325 ml  Output -  Net 2325 ml   Filed Weights   10/03/17 2018  Weight: 78.4 kg (172 lb 13.5 oz)    Examination:  General exam: Appears calm and comfortable  Respiratory system: Clear to auscultation. Respiratory effort normal. Cardiovascular system: S1 & S2 heard, RRR. No JVD, murmurs, rubs, gallops or clicks. No pedal edema. Gastrointestinal system: Abdomen is nondistended, soft and nontender. No organomegaly or masses felt. Normal bowel sounds heard. Central nervous system: Alert and oriented. No focal neurological deficits. Extremities: Symmetric 5 x 5  power. Skin: Left lower extremity dressing in place, has an open wound with surrounding erythema and foul-smelling Psychiatry: Judgement and insight appear normal. Mood & affect appropriate.     Data Reviewed:   CBC: Recent Labs  Lab  10/03/17 1215 10/04/17 0221  WBC 6.2 5.5  NEUTROABS 4.0  --   HGB 14.7 12.5  HCT 40.8 36.3  MCV 87.7 87.3  PLT 194 180   Basic Metabolic Panel: Recent Labs  Lab 10/03/17 1215 10/04/17 0221  NA 127* 138  K 5.0 3.4*  CL 93* 108  CO2 23 23  GLUCOSE 568* 186*  BUN 18 10  CREATININE 0.80 0.63  CALCIUM 9.5 8.0*   GFR: CrCl cannot be calculated (Unknown ideal weight.). Liver Function Tests: Recent Labs  Lab 10/03/17 1215  AST 23  ALT 17  ALKPHOS 121  BILITOT 0.9  PROT 7.5  ALBUMIN 3.3*   No results for input(s): LIPASE, AMYLASE in the last 168 hours. No results for input(s): AMMONIA in the last 168 hours. Coagulation Profile: No results for input(s): INR, PROTIME in the last 168 hours. Cardiac Enzymes: No results for input(s): CKTOTAL, CKMB, CKMBINDEX, TROPONINI in the last 168 hours. BNP (last 3 results) No results for input(s): PROBNP in the last 8760 hours. HbA1C: Recent Labs    10/03/17 1553  HGBA1C 12.7*   CBG: Recent Labs  Lab 10/03/17 1750 10/03/17 1827 10/03/17 2214 10/04/17 0741 10/04/17 1141  GLUCAP 238* 180* 135* 223* 195*   Lipid Profile: No results for input(s): CHOL, HDL, LDLCALC, TRIG, CHOLHDL, LDLDIRECT in the last 72 hours. Thyroid Function Tests: No results for input(s): TSH, T4TOTAL, FREET4, T3FREE, THYROIDAB in the last 72 hours. Anemia Panel: No results for input(s): VITAMINB12, FOLATE, FERRITIN, TIBC, IRON, RETICCTPCT in the last 72 hours. Sepsis Labs: Recent Labs  Lab 10/03/17 1234 10/03/17 1536  LATICACIDVEN 2.15* 1.2    No results found for this or any previous visit (from the past 240 hour(s)).       Radiology Studies: Mr Foot Left Wo Contrast  Result Date: 10/04/2017 CLINICAL DATA:  Left foot swelling. History diabetes. Ulcer along the plantar aspect of the foot. EXAM: MRI OF THE LEFT FOOT WITHOUT CONTRAST TECHNIQUE: Multiplanar, multisequence MR imaging of the left forefoot was performed. No intravenous contrast  was administered. COMPARISON:  None. FINDINGS: TENDONS Peroneal: Peroneal longus tendon intact. Mild tendinosis of peroneus brevis with a longitudinal split tear. Posteromedial: Mild tendinosis of the posterior tibial tendon. Flexor hallucis longus tendon intact. Flexor digitorum longus tendon intact. Anterior: Tibialis anterior tendon intact. Extensor hallucis longus tendon intact Extensor digitorum longus tendon intact. Achilles:  Intact. Plantar Fascia: Thickening and increased signal in the medial band of the plantar fascia at the calcaneal insertion consistent with plantar fasciitis. LIGAMENTS Lateral: Anterior talofibular ligament intact. Calcaneofibular ligament intact. Posterior talofibular ligament intact. Anterior and posterior tibiofibular ligaments intact. Medial: Deltoid ligament intact. Spring ligament intact. CARTILAGE Ankle Joint: No joint effusion. Partial-thickness cartilage loss of the tibiotalar joint. Subtalar Joints/Sinus Tarsi: Normal subtalar joints. No subtalar joint effusion. Normal sinus tarsi. Bones: No acute fracture or dislocation. 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 Mild osteoarthritis of the second, third and fourth tarsometatarsal joints. No acute fracture or dislocation. No periosteal reaction or bone destruction. Soft Tissue: Large soft tissue ulcer along the plantar medial aspect of midfoot extending deep to the plantar fascia most consistent with cellulitis. No focal fluid collection to suggest an abscess. Diffuse atrophy  of the plantar musculature. IMPRESSION: 1. Large soft tissue ulcer along the plantar medial aspect of midfoot extending deep to the plantar fascia most consistent with cellulitis. 2. No osteomyelitis of the left foot. 3. Mild tendinosis of peroneus brevis with a longitudinal split tear. 4. Mild tendinosis of the posterior tibial tendon. 5. Thickening and increased signal in the medial  band of the plantar fascia at the calcaneal insertion consistent with plantar fasciitis. Electronically Signed   By: Elige Ko   On: 10/04/2017 10:01   Dg Foot Complete Left  Result Date: 10/03/2017 CLINICAL DATA:  Diabetic ulcer plantar surface of the foot. EXAM: LEFT FOOT - COMPLETE 3+ VIEW COMPARISON:  No recent prior. FINDINGS: No acute bony or joint abnormality. No evidence of fracture dislocation. Ulceration noted along the plantar aspect of the midportion of the left foot. No radiopaque foreign body. Peripheral vascular calcification IMPRESSION: . Prominent soft tissue ulceration of the midportion of the plantar aspect of the left foot. No radiopaque foreign body. No focal acute bony abnormality. 2.  Diffuse degenerative change. Electronically Signed   By: Maisie Fus  Register   On: 10/03/2017 13:43        Scheduled Meds: . enoxaparin (LOVENOX) injection  40 mg Subcutaneous Q24H  . insulin aspart  0-15 Units Subcutaneous TID WC  . insulin aspart  0-5 Units Subcutaneous QHS  . insulin glargine  15 Units Subcutaneous Daily  . lisinopril  2.5 mg Oral Daily  . mupirocin ointment   Topical Daily   Continuous Infusions: . sodium chloride Stopped (10/04/17 0700)     LOS: 1 day    Time spent: 31 mins   Ankit Joline Maxcy, MD [facility] Pager [phone]   If 7PM-7AM, please contact night-coverage [email or web address] Password [email or web address] 10/04/2017, 1:30 PM

## 2017-10-05 ENCOUNTER — Encounter (HOSPITAL_COMMUNITY): Payer: Self-pay

## 2017-10-05 ENCOUNTER — Other Ambulatory Visit: Payer: Self-pay

## 2017-10-05 LAB — GLUCOSE, CAPILLARY
GLUCOSE-CAPILLARY: 314 mg/dL — AB (ref 65–99)
GLUCOSE-CAPILLARY: 306 mg/dL — AB (ref 65–99)
Glucose-Capillary: 396 mg/dL — ABNORMAL HIGH (ref 65–99)
Glucose-Capillary: 280 mg/dL — ABNORMAL HIGH (ref 65–99)
Glucose-Capillary: 285 mg/dL — ABNORMAL HIGH (ref 65–99)

## 2017-10-05 MED ORDER — PRO-STAT SUGAR FREE PO LIQD
30.0000 mL | Freq: Every day | ORAL | Status: DC
  Administered 2017-10-05 – 2017-10-06 (×2): 30 mL via ORAL
  Filled 2017-10-05 (×2): qty 30

## 2017-10-05 MED ORDER — INSULIN GLARGINE 100 UNIT/ML ~~LOC~~ SOLN
22.0000 [IU] | Freq: Every day | SUBCUTANEOUS | Status: DC
  Administered 2017-10-06: 22 [IU] via SUBCUTANEOUS
  Filled 2017-10-05: qty 0.22

## 2017-10-05 MED ORDER — INSULIN GLARGINE 100 UNIT/ML ~~LOC~~ SOLN
7.0000 [IU] | Freq: Once | SUBCUTANEOUS | Status: AC
Start: 2017-10-05 — End: 2017-10-05
  Administered 2017-10-05: 7 [IU] via SUBCUTANEOUS
  Filled 2017-10-05: qty 0.07

## 2017-10-05 MED ORDER — ALUM & MAG HYDROXIDE-SIMETH 200-200-20 MG/5ML PO SUSP
30.0000 mL | ORAL | Status: DC | PRN
  Administered 2017-10-05: 30 mL via ORAL
  Filled 2017-10-05: qty 30

## 2017-10-05 NOTE — Progress Notes (Signed)
PROGRESS NOTE    Stacey Duffy  NAT:[id number] DOB: [birth year] DOA: 10/03/2017 PCP: Patient, No Pcp Per   Brief Narrative:  72 year old female with history of diabetes mellitus type 2 but not taking any medications came to the hospital with complains of intermittent dizziness.  Patient has immigrated here from [place] about 5 months ago and did not bring any of her medications here.  During this time she has continued to have increasing urine output but did not check her blood sugars.  Over the last couple of days her symptoms worsen therefore came to the ER.  In the ER she was noted to have left foot nonhealing ulcer with some drainage and erythema around the site.  She was also noted to be hyperglycemic without anion gap acidosis therefore patient was admitted for further care management to the hospital.  Upon admission patient had an MRI is negative for osteomyelitis but shows deep tissue infection therefore patient started on vancomycin and Zosyn.  Podiatry has been consulted.   Assessment & Plan:   Principal Problem:   Hyperglycemia Active Problems:   Diabetes ([place])   Foot ulcer (HCC)   Hyponatremia  Infected diabetic left foot ulcer with surrounding cellulitis Purulent cellulitis left foot -Some foul-smelling drainage therefore will order cultures.  X-ray of the foot shows soft tissue swelling, MRI is negative for osteomyelitis but shows deep tissue infection -Patient seen by wound care -Vancomycin Zosyn day 2 -Podiatry has been consulted, will see the patient today.  I have spoken with him again today.  Uncontrolled diabetes mellitus type 1 -Diabetic coordinator has been consulted, diabetic diet ordered - Patient started on sliding scale  -Elevated blood glucose on Lantus 15 units daily, will give additional 7 units today and increase the dosage to 22 units starting tomorrow. -On lisinopril 2.5 mg daily  DVT prophylaxis: Lovenox Code Status: Full code Family  Communication: Daughter at bedside Disposition Plan: To be determined  Consultants:   Podiatry  Procedures:   None  Antimicrobials:   Vancomycin 4/17 >  Zosyn 4/17 >   Subjective: No acute events overnight.  Patient is any complaints.  She remains afebrile.  Review of Systems Otherwise negative except as per HPI, including: General: Denies fever, chills, night sweats or unintended weight loss. Resp: Denies cough, wheezing, shortness of breath. Cardiac: Denies chest pain, palpitations, orthopnea, paroxysmal nocturnal dyspnea. GI: Denies abdominal pain, nausea, vomiting, diarrhea or constipation GU: Denies dysuria, frequency, hesitancy or incontinence MS: Left foot pain Neuro: Denies headache, neurologic deficits (focal weakness, numbness, tingling), abnormal gait Psych: Denies anxiety, depression, SI/HI/AVH Skin: Denies new rashes or lesions ID: Denies sick contacts, exotic exposures, travel  Objective: Vitals:   10/04/17 0739 10/04/17 2000 10/05/17 0145 10/05/17 0741  BP: 127/63  (!) 143/73 (!) 149/74  Pulse: 73  73 71  Resp: 14  16 17   Temp: 97.8 F (36.6 C)  98.2 F (36.8 C) 97.7 F (36.5 C)  TempSrc: Oral  Oral Oral  SpO2: 98%  100% 99%  Weight:  78.4 kg (172 lb 13.5 oz)    Height:  5' 3"  (1.6 m)      Intake/Output Summary (Last 24 hours) at 10/05/2017 1153 Last data filed at 10/04/2017 2108 Gross per 24 hour  Intake 2286 ml  Output -  Net 2286 ml   Filed Weights   10/03/17 2018 10/04/17 2000  Weight: 78.4 kg (172 lb 13.5 oz) 78.4 kg (172 lb 13.5 oz)    Examination:  General exam: Appears calm and  comfortable  Respiratory system: Clear to auscultation. Respiratory effort normal. Cardiovascular system: S1 & S2 heard, RRR. No JVD, murmurs, rubs, gallops or clicks. No pedal edema. Gastrointestinal system: Abdomen is nondistended, soft and nontender. No organomegaly or masses felt. Normal bowel sounds heard. Central nervous system: Alert and oriented.  No focal neurological deficits. Extremities: Symmetric 5 x 5 power. Skin: Left lower extremity dressing in place, has an open wound with surrounding erythema and foul-smelling Psychiatry: Judgement and insight appear normal. Mood & affect appropriate.     Data Reviewed:   CBC: Recent Labs  Lab 10/03/17 1215 10/04/17 0221  WBC 6.2 5.5  NEUTROABS 4.0  --   HGB 14.7 12.5  HCT 40.8 36.3  MCV 87.7 87.3  PLT 194 740   Basic Metabolic Panel: Recent Labs  Lab 10/03/17 1215 10/04/17 0221  NA 127* 138  K 5.0 3.4*  CL 93* 108  CO2 23 23  GLUCOSE 568* 186*  BUN 18 10  CREATININE 0.80 0.63  CALCIUM 9.5 8.0*   GFR: Estimated Creatinine Clearance: 63 mL/min (by C-G formula based on SCr of 0.63 mg/dL). Liver Function Tests: Recent Labs  Lab 10/03/17 1215  AST 23  ALT 17  ALKPHOS 121  BILITOT 0.9  PROT 7.5  ALBUMIN 3.3*   No results for input(s): LIPASE, AMYLASE in the last 168 hours. No results for input(s): AMMONIA in the last 168 hours. Coagulation Profile: No results for input(s): INR, PROTIME in the last 168 hours. Cardiac Enzymes: No results for input(s): CKTOTAL, CKMB, CKMBINDEX, TROPONINI in the last 168 hours. BNP (last 3 results) No results for input(s): PROBNP in the last 8760 hours. HbA1C: Recent Labs    10/03/17 1553  HGBA1C 12.7*   CBG: Recent Labs  Lab 10/04/17 1141 10/04/17 1634 10/04/17 2107 10/05/17 0743 10/05/17 0924  GLUCAP 195* 258* 289* 314* 396*   Lipid Profile: No results for input(s): CHOL, HDL, LDLCALC, TRIG, CHOLHDL, LDLDIRECT in the last 72 hours. Thyroid Function Tests: No results for input(s): TSH, T4TOTAL, FREET4, T3FREE, THYROIDAB in the last 72 hours. Anemia Panel: No results for input(s): VITAMINB12, FOLATE, FERRITIN, TIBC, IRON, RETICCTPCT in the last 72 hours. Sepsis Labs: Recent Labs  Lab 10/03/17 1234 10/03/17 1536  LATICACIDVEN 2.15* 1.2    Recent Results (from the past 240 hour(s))  Aerobic Culture  (superficial specimen)     Status: None (Preliminary result)   Collection Time: 10/05/17  1:48 AM  Result Value Ref Range Status   Specimen Description FOOT  Final   Special Requests NONE  Final   Gram Stain   Final    NO WBC SEEN FEW GRAM POSITIVE RODS FEW GRAM NEGATIVE RODS RARE GRAM POSITIVE COCCI Performed at [facility], [address] [place], [place] [address]    Culture PENDING  Incomplete   Report Status PENDING  Incomplete  Aerobic/Anaerobic Culture (surgical/deep wound)     Status: None (Preliminary result)   Collection Time: 10/05/17  6:46 AM  Result Value Ref Range Status   Specimen Description FOOT  Final   Special Requests LEFT  Final   Gram Stain   Final    RARE WBC PRESENT, PREDOMINANTLY PMN RARE GRAM POSITIVE COCCI Performed at [facility], [place] [address] [place], [place] [address]    Culture PENDING  Incomplete   Report Status PENDING  Incomplete         Radiology Studies: Mr Foot Left Wo Contrast  Result Date: 10/04/2017 CLINICAL DATA:  Left  foot swelling. History diabetes. Ulcer along the plantar aspect of the foot. EXAM: MRI OF THE LEFT FOOT WITHOUT CONTRAST TECHNIQUE: Multiplanar, multisequence MR imaging of the left forefoot was performed. No intravenous contrast was administered. COMPARISON:  None. FINDINGS: TENDONS Peroneal: Peroneal longus tendon intact. Mild tendinosis of peroneus brevis with a longitudinal split tear. Posteromedial: Mild tendinosis of the posterior tibial tendon. Flexor hallucis longus tendon intact. Flexor digitorum longus tendon intact. Anterior: Tibialis anterior tendon intact. Extensor hallucis longus tendon intact Extensor digitorum longus tendon intact. Achilles:  Intact. Plantar Fascia: Thickening and increased signal in the medial band of the plantar fascia at the calcaneal insertion consistent with plantar fasciitis. LIGAMENTS Lateral: Anterior talofibular ligament intact. Calcaneofibular ligament intact.  Posterior talofibular ligament intact. Anterior and posterior tibiofibular ligaments intact. Medial: Deltoid ligament intact. Spring ligament intact. CARTILAGE Ankle Joint: No joint effusion. Partial-thickness cartilage loss of the tibiotalar joint. Subtalar Joints/Sinus Tarsi: Normal subtalar joints. No subtalar joint effusion. Normal sinus tarsi. Bones: No acute fracture or dislocation. 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 Mild osteoarthritis of the second, third and fourth tarsometatarsal joints. No acute fracture or dislocation. No periosteal reaction or bone destruction. Soft Tissue: Large soft tissue ulcer along the plantar medial aspect of midfoot extending deep to the plantar fascia most consistent with cellulitis. No focal fluid collection to suggest an abscess. Diffuse atrophy of the plantar musculature. IMPRESSION: 1. Large soft tissue ulcer along the plantar medial aspect of midfoot extending deep to the plantar fascia most consistent with cellulitis. 2. No osteomyelitis of the left foot. 3. Mild tendinosis of peroneus brevis with a longitudinal split tear. 4. Mild tendinosis of the posterior tibial tendon. 5. Thickening and increased signal in the medial band of the plantar fascia at the calcaneal insertion consistent with plantar fasciitis. Electronically Signed   By: Kathreen Devoid   On: 10/04/2017 10:01   Dg Foot Complete Left  Result Date: 10/03/2017 CLINICAL DATA:  Diabetic ulcer plantar surface of the foot. EXAM: LEFT FOOT - COMPLETE 3+ VIEW COMPARISON:  No recent prior. FINDINGS: No acute bony or joint abnormality. No evidence of fracture dislocation. Ulceration noted along the plantar aspect of the midportion of the left foot. No radiopaque foreign body. Peripheral vascular calcification IMPRESSION: . Prominent soft tissue ulceration of the midportion of the plantar aspect of the left foot. No radiopaque foreign body.  No focal acute bony abnormality. 2.  Diffuse degenerative change. Electronically Signed   By: [place]   On: 10/03/2017 13:43        Scheduled Meds: . enoxaparin (LOVENOX) injection  40 mg Subcutaneous Q24H  . insulin aspart  0-15 Units Subcutaneous TID WC  . insulin aspart  0-5 Units Subcutaneous QHS  . insulin glargine  15 Units Subcutaneous Daily  . insulin starter kit- syringes  1 kit Other Once  . lisinopril  2.5 mg Oral Daily  . mupirocin ointment   Topical Daily   Continuous Infusions: . piperacillin-tazobactam (ZOSYN)  IV Stopped (10/05/17 1148)  . vancomycin       LOS: 2 days    Time spent: 25 mins   Arius Harnois Arsenio Loader, MD [facility] Pager [phone]   If 7PM-7AM, please contact night-coverage [email or web address] Password [email or web address] 10/05/2017, 11:53 AM

## 2017-10-05 NOTE — Progress Notes (Signed)
Noting the increased order of Lantus 22 units daily, the approximate dosage for 70/30 insulin would be 70/30 20 units BID (70% =28 units total basal and 30%=12 units total meal coverage). This would be the most affordable insulin available. Will continue to monitor blood sugars while in the hospital.  Smith MinceKendra Mauro Arps RN BSN CDE Diabetes Coordinator Pager: [phone]  8am-5pm

## 2017-10-05 NOTE — Progress Notes (Signed)
Initial Nutrition Assessment  DOCUMENTATION CODES:   Obesity unspecified  INTERVENTION:    Prostat liquid protein po 30 ml daily, each supplement provides 100 kcal, 15 grams protein  NUTRITION DIAGNOSIS:   Increased nutrient needs related to wound healing as evidenced by estimated needs  GOAL:   Patient will meet greater than or equal to 90% of their needs  MONITOR:   PO intake, Supplement acceptance, Skin, Labs, Weight trends, I & O's  REASON FOR ASSESSMENT:   Consult Assessment of nutrition requirement/status, Diet education  ASSESSMENT:   72 y.o. female with PMH significant for DM presents to the emergency Department chief complaint ongoing generalized weakness intermittent dizziness. Initial evaluation reveals hyperglycemia.  RD met with pt and daughter at bedside. Pt is from [place]. Does not speak [place]. Daughter translates. Appetite is okay. She consumed 25% of her breakfast this am.  Not meeting protein needs. Will add liquid supplement. Pt does not know if she's recently lost weight. Labs and medications reviewed. K 3.4 (L). CBG's [phone].  NUTRITION - FOCUSED PHYSICAL EXAM:  Completed. No muscle or fat depletion noticed.  Diet Order:  Diet Carb Modified Fluid consistency: Thin; Room service appropriate? Yes  EDUCATION NEEDS:   Education needs have been addressed  Skin:  Skin Assessment: Skin Integrity Issues: Skin Integrity Issues:: Diabetic Ulcer Diabetic Ulcer: neuropathic nonhealing wound L foot  Last BM:  4/18  Height:   Ht Readings from Last 1 Encounters:  10/04/17 _0  (1.6 m)   Weight:   Wt Readings from Last 1 Encounters:  10/04/17 172 lb 13.5 oz (78.4 kg)   BMI:  Body mass index is 30.62 kg/m.  Estimated Nutritional Needs:   Kcal:  1600-1800  Protein:  80-95 gm  Fluid:  1.6-1.8 L  Arthur Holms, RD, LDN Pager #: [phone] After-Hours Pager #: [phone]

## 2017-10-05 NOTE — Plan of Care (Signed)
Nutrition Consult/Education Note  RD consulted for nutrition education regarding diabetes.   Lab Results  Component Value Date   HGBA1C 12.7 (H) 10/03/2017    RD provided "Carbohydrate Counting for People with Diabetes" handout from the Academy of Nutrition and Dietetics. Discussed different food groups and their effects on blood sugar, emphasizing carbohydrate-containing foods. Provided list of carbohydrates and recommended serving sizes of common foods.  Discussed importance of controlled and consistent carbohydrate intake throughout the day. Provided examples of ways to balance meals/snacks and encouraged intake of high-fiber, whole grain complex carbohydrates. Teach back method used.  Expect poor compliance. Daughter states "she's not going to change".  Maureen ChattersKatie Christine Schiefelbein, RD, LDN Pager #: [phone] After-Hours Pager #: [phone]

## 2017-10-05 NOTE — Consult Note (Signed)
   Brief Narrative:  72 year old female with history of diabetes mellitus type 2 from [place] and does not speak English presented to the ED on 10/03/17 for DKA and CO2 of 23. Patient admitted and presents today with her daughter, who does speak English, regarding a full-thickness ulcer to the plantar aspect of the left midfoot. MRI and XR taken while inpatient are negative for osteomyelitis and there does not appear to be any evidence of active cellulitis upon evaluation. Patient states ulcer has been present for a 'long time'.   CBC Latest Ref Rng & Units 10/04/2017 10/03/2017  WBC 4.0 - 10.5 K/uL 5.5 6.2  Hemoglobin 12.0 - 15.0 g/dL 95.212.5 84.114.7  Hematocrit 32.436.0 - 46.0 % 36.3 40.8  Platelets 150 - 400 K/uL 180 194     Objective/Physical Exam General: The patient is alert and oriented x3 in no acute distress.  Dermatology:  Wound #1 noted to the plantar aspect of the left midfoot approximately 2.5x2.0x0.7 cm (LxWxD).   To the noted ulceration, there is no eschar. There is a moderate amount of slough, fibrin, and necrotic tissue noted within the wound base. Granulation tissue and wound base is red. There is a minimal amount of serosanguineous drainage noted. There is no exposed bone muscle-tendon ligament or joint. There is no malodor. Periwound integrity is significantly callused. Skin is warm, dry and supple bilateral lower extremities.  Vascular: Vascular status intact. No edema or erythema noted. Capillary refill within normal limits.  Neurological: Epicritic and protective threshold absent bilaterally.   Musculoskeletal Exam: Marked limited ROM to the pedal joints with likely DJD and midfoot arch collapse.   Assessment: #1 ulcer left foot secondary to diabetes mellitus #2 diabetes mellitus type II, uncontrolled w/ peripheral neuropathy  Foot ulcer (HCC) - Plan: CANCELED: DG Foot 2 Views Left, CANCELED: DG Foot 2 Views Left    Plan of Care:  1. Patient was evaluated. 2. Ulcer  appears stable, chronic without any acute cellulitis or evidence of acute changes.  3. From a podiatry standpoint, the patient is stable and okay to be discharged with instructions provided to the daughter to call our office and arrange an appt for next week to begin in-office wound care management consisting of serial debridement and wound care.  4. Recommend dressing changes of 4x4 gauze and hypoallergenic tape at home daily.  Podiatry will sign off. Thank you for the consult.    Felecia ShellingBrent M. Lessa Huge, DPM [facility]  Dr. Felecia ShellingBrent M. Cordae Mccarey, DPM    [address]                                        [place], [place] [address]                Office [phone]  Fax [phone]

## 2017-10-06 DIAGNOSIS — R739 Hyperglycemia, unspecified: Secondary | ICD-10-CM

## 2017-10-06 LAB — BASIC METABOLIC PANEL
Anion gap: 10 (ref 5–15)
BUN: 19 mg/dL (ref 6–20)
CALCIUM: 9 mg/dL (ref 8.9–10.3)
CO2: 23 mmol/L (ref 22–32)
CREATININE: 0.93 mg/dL (ref 0.44–1.00)
Chloride: 98 mmol/L — ABNORMAL LOW (ref 101–111)
GFR calc Af Amer: >60 mL/min (ref >60)
GFR, EST NON AFRICAN AMERICAN: 60 mL/min — AB (ref >60)
Glucose, Bld: 329 mg/dL — ABNORMAL HIGH (ref 65–99)
Potassium: 4.3 mmol/L (ref 3.5–5.1)
Sodium: 131 mmol/L — ABNORMAL LOW (ref 135–145)

## 2017-10-06 LAB — GLUCOSE, CAPILLARY: Glucose-Capillary: 279 mg/dL — ABNORMAL HIGH (ref 65–99)

## 2017-10-06 LAB — MAGNESIUM: Magnesium: 1.8 mg/dL (ref 1.7–2.4)

## 2017-10-06 MED ORDER — LISINOPRIL 2.5 MG PO TABS: 2.5000 mg | ORAL_TABLET | Freq: Every day | ORAL | 0 refills | Status: AC

## 2017-10-06 MED ORDER — BLOOD GLUCOSE METER KIT: PACK | 0 refills | Status: AC

## 2017-10-06 MED ORDER — "INSULIN SYRINGE 31G X 5/16"" 0.3 ML MISC": 1.0000 | Freq: Two times a day (BID) | 3 refills | Status: AC

## 2017-10-06 MED ORDER — INSULIN STARTER KIT- SYRINGES (ENGLISH): 1.0000 | Freq: Once | 0 refills | Status: AC

## 2017-10-06 MED ORDER — INSULIN NPH ISOPHANE & REGULAR (70-30) 100 UNIT/ML ~~LOC~~ SUSP: 20.0000 [IU] | Freq: Two times a day (BID) | SUBCUTANEOUS | 5 refills | Status: AC

## 2017-10-06 MED ORDER — INSULIN NPH (HUMAN) (ISOPHANE) 100 UNIT/ML ~~LOC~~ SUSP: 22.0000 [IU] | Freq: Two times a day (BID) | SUBCUTANEOUS | 1 refills | Status: DC

## 2017-10-06 NOTE — Progress Notes (Signed)
Patient discharged home with Son. Reviewed discharge instructions with pt and son. Diabetic coordinator also present discussing d/c instructions in regards to blood glucose management and insulin administration. IV site removed from left forearm with bandage placed.

## 2017-10-06 NOTE — Progress Notes (Signed)
Patient is for discharge today, Florentina AddisonKatie RN will show patient and her son how to do dressing change.  She has no insurance or pcp.

## 2017-10-06 NOTE — Discharge Summary (Addendum)
Physician Discharge Summary  Alexia Dinger CZY:[id number] DOB: 05-23-1946 DOA: 10/03/2017  PCP: Patient, No Pcp Per  Admit date: 10/03/2017 Discharge date: 10/06/2017  Admitted From: Disposition:    Recommendations for Outpatient Follow-up:  1. Follow up with PCP in 1-2 weeks 2. Please obtain BMP/CBC in one week your next doctors visit.  3. Novolin 70/30 20 units twice daily has been prescribed.  Advised to keep blood glucose log 4 times daily, 3 times before meals and 1 time before bedtime.  Also advised to document when she takes her insulin.  Diabetic education has been provided along with resources 4. Follow with outpatient podiatry, tried foot and ankle with Dr. Amalia Hailey in 1 week 5. Dressing change at home daily with 4x4 gauze and hypoallergenic tape  Home Health: None Equipment/Devices: None Discharge Condition: Stable CODE STATUS: Full code Diet recommendation: Diabetic diet  Brief/Interim Summary: 72 year old female with history of diabetes mellitus type 2 but not taking any medications came to the hospital with complains of intermittent dizziness.  Patient has immigrated here from [place] about 5 months ago and did not bring any of her medications here.  During this time she has continued to have increasing urine output but did not check her blood sugars.  Over the last couple of days her symptoms worsen therefore came to the ER.  In the ER she was noted to have left foot nonhealing ulcer with some drainage and erythema around the site.  She was also noted to be hyperglycemic without anion gap acidosis therefore patient was admitted for further care management to the hospital.  Upon admission patient had an MRI is negative for osteomyelitis but shows deep tissue infection therefore patient started on vancomycin and Zosyn.  Podiatry has been consulted-who examined the foot and did not necessarily think it was infected therefore recommended dressing changes with 4x4 gauze and  hypoallergenic tape at home daily.  Recommended outpatient follow-up in 1 week for wound care.  At this point will discontinue her antibiotics and discharge her. In terms of her diabetes her hemoglobin A1c was greater than 12 therefore started on Lantus while inpatient and eventually discharged her on Novolin 70/30 20 units twice daily with instructions to keep a blood glucose labs in the times she takes her insulin.  She was also provided with resources.  Today she has reached maximum benefit from hospital stay and stable to be discharged with outpatient follow-up recommendations as stated above.     Discharge Diagnoses:  Principal Problem:   Hyperglycemia Active Problems:   Diabetes ([place])   Foot ulcer (HCC)   Hyponatremia   Infected diabetic left foot ulcer with surrounding cellulitis, greatly improved -Some foul-smelling drainage therefore will order cultures.  X-ray of the foot shows soft tissue swelling, MRI is negative for osteomyelitis but shows deep tissue infection -Patient seen by wound care -her foot was examined by the podiatrist they did not think the foot was infected and appeared more chronic therefore I have discontinued her vancomycin and Zosyn.  She is to see outpatient podiatry in 1 week, dressing changes with 4X 4 gauze and hypoallergenic tape daily at home.   Uncontrolled diabetes mellitus type 1 -Patient has been provided with education with the assistance of diabetic coordinator.  Her hemoglobin A1c is greater than 12.7.  She is to keep a log of her blood glucose at home 3 times daily before meal and 1 tab before bedtime.  Also asked to document what time she takes her insulin.  She  will go home on Novolin 70/30 20 units twice daily.  Resources has been provided  I have discussed the patient's care with her son was present at bedside.  Also explained to the son the importance of keeping her outpatient follow-up appointment and routine wound care dressing and checks  to help further prevent worsening of her ulcer.  He understands this and states she will do as instructed.  DVT prophylaxis: Lovenox Code Status: Full code Family Communication:  Son at bedside bedside Disposition Plan:  Discharge today     Discharge Instructions   Allergies as of 10/06/2017   No Known Allergies     Medication List    TAKE these medications   ACID CONTROLLER PO Take 1 tablet by mouth daily.   insulin NPH-regular Human (70-30) 100 UNIT/ML injection Commonly known as:  NOVOLIN 70/30 RELION Inject 20 Units into the skin 2 (two) times daily with a meal.   insulin starter kit- syringes Misc 1 kit by Other route once for 1 dose.   INSULIN SYRINGE .3CC/31GX5/16" 31G X 5/16" 0.3 ML Misc 1 packet by Does not apply route 2 (two) times daily.   lisinopril 2.5 MG tablet Commonly known as:  PRINIVIL,ZESTRIL Take 1 tablet (2.5 mg total) by mouth daily. Start taking on:  10/07/2017      Follow-up [place] Follow up.   Why:  Call to [place] PCP, Can go here without insurance Contact information: [place] [address] [phone]       [facility] Follow up.   Why:   Call on Monday to [place] PCP, Can go here without insurance Contact information: [address] [place] [phone]       Edrick Kins, DPM. Schedule an appointment as soon as possible for a visit in 1 week(s).   Specialty:  Podiatry Contact information: [address] [place] [address] [phone]          No Known Allergies  You were cared for by a hospitalist during your hospital stay. If you have any questions about your discharge medications or the care you received while you were in the hospital after you are discharged, you can call the unit and asked to speak with the hospitalist on call if the hospitalist that took care of  you is not available. Once you are discharged, your primary care physician will handle any further medical issues. Please note that no refills for any discharge medications will be authorized once you are discharged, as it is imperative that you return to your primary care physician (or establish a relationship with a primary care physician if you do not have one) for your aftercare needs so that they can reassess your need for medications and monitor your lab values.  Consultations:  Podiatry   Procedures/Studies: Mr Foot Left Wo Contrast  Result Date: 10/04/2017 CLINICAL DATA:  Left foot swelling. History diabetes. Ulcer along the plantar aspect of the foot. EXAM: MRI OF THE LEFT FOOT WITHOUT CONTRAST TECHNIQUE: Multiplanar, multisequence MR imaging of the left forefoot was performed. No intravenous contrast was administered. COMPARISON:  None. FINDINGS: TENDONS Peroneal: Peroneal longus tendon intact. Mild tendinosis of peroneus brevis with a longitudinal split tear. Posteromedial: Mild tendinosis of the posterior tibial tendon. Flexor hallucis longus tendon intact. Flexor digitorum longus tendon intact. Anterior: Tibialis anterior tendon intact. Extensor hallucis longus tendon intact Extensor digitorum longus  tendon intact. Achilles:  Intact. Plantar Fascia: Thickening and increased signal in the medial band of the plantar fascia at the calcaneal insertion consistent with plantar fasciitis. LIGAMENTS Lateral: Anterior talofibular ligament intact. Calcaneofibular ligament intact. Posterior talofibular ligament intact. Anterior and posterior tibiofibular ligaments intact. Medial: Deltoid ligament intact. Spring ligament intact. CARTILAGE Ankle Joint: No joint effusion. Partial-thickness cartilage loss of the tibiotalar joint. Subtalar Joints/Sinus Tarsi: Normal subtalar joints. No subtalar joint effusion. Normal sinus tarsi. Bones: No acute fracture or dislocation. 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 Mild osteoarthritis of the second, third and fourth tarsometatarsal joints. No acute fracture or dislocation. No periosteal reaction or bone destruction. Soft Tissue: Large soft tissue ulcer along the plantar medial aspect of midfoot extending deep to the plantar fascia most consistent with cellulitis. No focal fluid collection to suggest an abscess. Diffuse atrophy of the plantar musculature. IMPRESSION: 1. Large soft tissue ulcer along the plantar medial aspect of midfoot extending deep to the plantar fascia most consistent with cellulitis. 2. No osteomyelitis of the left foot. 3. Mild tendinosis of peroneus brevis with a longitudinal split tear. 4. Mild tendinosis of the posterior tibial tendon. 5. Thickening and increased signal in the medial band of the plantar fascia at the calcaneal insertion consistent with plantar fasciitis. Electronically Signed   By: Kathreen Devoid   On: 10/04/2017 10:01   Dg Foot Complete Left  Result Date: 10/03/2017 CLINICAL DATA:  Diabetic ulcer plantar surface of the foot. EXAM: LEFT FOOT - COMPLETE 3+ VIEW COMPARISON:  No recent prior. FINDINGS: No acute bony or joint abnormality. No evidence of fracture dislocation. Ulceration noted along the plantar aspect of the midportion of the left foot. No radiopaque foreign body. Peripheral vascular calcification IMPRESSION: . Prominent soft tissue ulceration of the midportion of the plantar aspect of the left foot. No radiopaque foreign body. No focal acute bony abnormality. 2.  Diffuse degenerative change. Electronically Signed   By: Marcello Moores  Register   On: 10/03/2017 13:43     Subjective:   Discharge Exam: Vitals:   10/05/17 2319 10/06/17 0810  BP: (!) 124/54 (!) 126/91  Pulse: 73 72  Resp:  18  Temp: 98.7 F (37.1 C) 98.4 F (36.9 C)  SpO2: 98% 98%   Vitals:   10/05/17 0145 10/05/17 0741 10/05/17 2319 10/06/17 0810  BP: (!) 143/73 (!) 149/74  (!) 124/54 (!) 126/91  Pulse: 73 71 73 72  Resp: 16 17  18   Temp: 98.2 F (36.8 C) 97.7 F (36.5 C) 98.7 F (37.1 C) 98.4 F (36.9 C)  TempSrc: Oral Oral Oral Oral  SpO2: 100% 99% 98% 98%  Weight:      Height:        General: Pt is alert, awake, not in acute distress Cardiovascular: RRR, S1/S2 +, no rubs, no gallops Respiratory: CTA bilaterally, no wheezing, no rhonchi Abdominal: Soft, NT, ND, bowel sounds + Extremities: no edema, no cyanosis    The results of significant diagnostics from this hospitalization (including imaging, microbiology, ancillary and laboratory) are listed below for reference.     Microbiology: Recent Results (from the past 240 hour(s))  Culture, blood (Routine X 2) w Reflex to ID Panel     Status: None (Preliminary result)   Collection Time: 10/04/17  1:56 PM  Result Value Ref Range Status   Specimen Description BLOOD LEFT ANTECUBITAL  Final   Special Requests   Final    BOTTLES DRAWN AEROBIC  ONLY Blood Culture adequate volume   Culture   Final    NO GROWTH < 24 HOURS Performed at [facility], [place] [address], [place], [place] [address]    Report Status PENDING  Incomplete  Culture, blood (Routine X 2) w Reflex to ID Panel     Status: None (Preliminary result)   Collection Time: 10/04/17  2:00 PM  Result Value Ref Range Status   Specimen Description BLOOD LEFT ANTECUBITAL  Final   Special Requests   Final    BOTTLES DRAWN AEROBIC AND ANAEROBIC Blood Culture adequate volume   Culture   Final    NO GROWTH < 24 HOURS Performed at [facility], [place] [address] [place], [place] [address]    Report Status PENDING  Incomplete  Aerobic Culture (superficial specimen)     Status: None (Preliminary result)   Collection Time: 10/05/17  1:48 AM  Result Value Ref Range Status   Specimen Description FOOT  Final   Special Requests NONE  Final   Gram Stain   Final    NO WBC SEEN FEW GRAM POSITIVE RODS FEW GRAM NEGATIVE RODS RARE GRAM  POSITIVE COCCI Performed at [facility], [place] [address] [place], [place] [address]    Culture PENDING  Incomplete   Report Status PENDING  Incomplete  Aerobic/Anaerobic Culture (surgical/deep wound)     Status: None (Preliminary result)   Collection Time: 10/05/17  6:46 AM  Result Value Ref Range Status   Specimen Description FOOT  Final   Special Requests LEFT  Final   Gram Stain   Final    RARE WBC PRESENT, PREDOMINANTLY PMN RARE GRAM POSITIVE COCCI Performed at [facility], [place] [address], [place],  [address]    Culture PENDING  Incomplete   Report Status PENDING  Incomplete     Labs: BNP (last 3 results) No results for input(s): BNP in the last 8760 hours. Basic Metabolic Panel: Recent Labs  Lab 10/03/17 1215 10/04/17 0221 10/06/17 0300  NA 127* 138 131*  K 5.0 3.4* 4.3  CL 93* 108 98*  CO2 23 23 23   GLUCOSE 568* 186* 329*  BUN 18 10 19   CREATININE 0.80 0.63 0.93  CALCIUM 9.5 8.0* 9.0  MG  --   --  1.8   Liver Function Tests: Recent Labs  Lab 10/03/17 1215  AST 23  ALT 17  ALKPHOS 121  BILITOT 0.9  PROT 7.5  ALBUMIN 3.3*   No results for input(s): LIPASE, AMYLASE in the last 168 hours. No results for input(s): AMMONIA in the last 168 hours. CBC: Recent Labs  Lab 10/03/17 1215 10/04/17 0221  WBC 6.2 5.5  NEUTROABS 4.0  --   HGB 14.7 12.5  HCT 40.8 36.3  MCV 87.7 87.3  PLT 194 180   Cardiac Enzymes: No results for input(s): CKTOTAL, CKMB, CKMBINDEX, TROPONINI in the last 168 hours. BNP: Invalid input(s): POCBNP CBG: Recent Labs  Lab 10/05/17 0924 10/05/17 1202 10/05/17 1650 10/05/17 2132 10/06/17 0806  GLUCAP 396* 280* 285* 306* 279*   D-Dimer No results for input(s): DDIMER in the last 72 hours. Hgb A1c Recent Labs    10/03/17 1553  HGBA1C 12.7*   Lipid Profile No results for input(s): CHOL, HDL, LDLCALC, TRIG, CHOLHDL, LDLDIRECT in the last 72 hours. Thyroid function studies No results for input(s): TSH,  T4TOTAL, T3FREE, THYROIDAB in the last 72 hours.  Invalid input(s): FREET3 Anemia work up No results for input(s):  VITAMINB12, FOLATE, FERRITIN, TIBC, IRON, RETICCTPCT in the last 72 hours. Urinalysis    Component Value Date/Time   COLORURINE YELLOW 10/03/2017 1745   APPEARANCEUR CLEAR 10/03/2017 1745   LABSPEC 1.021 10/03/2017 1745   PHURINE 6.0 10/03/2017 1745   GLUCOSEU >=500 (A) 10/03/2017 1745   HGBUR SMALL (A) 10/03/2017 1745   BILIRUBINUR NEGATIVE 10/03/2017 1745   KETONESUR NEGATIVE 10/03/2017 1745   PROTEINUR NEGATIVE 10/03/2017 1745   NITRITE NEGATIVE 10/03/2017 1745   LEUKOCYTESUR LARGE (A) 10/03/2017 1745   Sepsis Labs Invalid input(s): PROCALCITONIN,  WBC,  LACTICIDVEN Microbiology Recent Results (from the past 240 hour(s))  Culture, blood (Routine X 2) w Reflex to ID Panel     Status: None (Preliminary result)   Collection Time: 10/04/17  1:56 PM  Result Value Ref Range Status   Specimen Description BLOOD LEFT ANTECUBITAL  Final   Special Requests   Final    BOTTLES DRAWN AEROBIC ONLY Blood Culture adequate volume   Culture   Final    NO GROWTH < 24 HOURS Performed at [facility], [place] [address], [place], [place] [address]    Report Status PENDING  Incomplete  Culture, blood (Routine X 2) w Reflex to ID Panel     Status: None (Preliminary result)   Collection Time: 10/04/17  2:00 PM  Result Value Ref Range Status   Specimen Description BLOOD LEFT ANTECUBITAL  Final   Special Requests   Final    BOTTLES DRAWN AEROBIC AND ANAEROBIC Blood Culture adequate volume   Culture   Final    NO GROWTH < 24 HOURS Performed at [facility], [place] [address] [place], [place] [address]    Report Status PENDING  Incomplete  Aerobic Culture (superficial specimen)     Status: None (Preliminary result)   Collection Time: 10/05/17  1:48 AM  Result Value Ref Range Status   Specimen Description FOOT  Final   Special Requests NONE  Final   Gram Stain   Final     NO WBC SEEN FEW GRAM POSITIVE RODS FEW GRAM NEGATIVE RODS RARE GRAM POSITIVE COCCI Performed at [facility], [place] [address], [place], [place] [address]    Culture PENDING  Incomplete   Report Status PENDING  Incomplete  Aerobic/Anaerobic Culture (surgical/deep wound)     Status: None (Preliminary result)   Collection Time: 10/05/17  6:46 AM  Result Value Ref Range Status   Specimen Description FOOT  Final   Special Requests LEFT  Final   Gram Stain   Final    RARE WBC PRESENT, PREDOMINANTLY PMN RARE GRAM POSITIVE COCCI Performed at [facility], [place] [address] [place],  [address]    Culture PENDING  Incomplete   Report Status PENDING  Incomplete     Time coordinating discharge: 32 mins  SIGNED:   Damita Lack, MD  [facility] 10/06/2017, 11:27 AM Pager   If 7PM-7AM, please contact night-coverage [email or web address] Password [email or web address]

## 2017-10-06 NOTE — Care Management Note (Addendum)
Case Management Note  Patient Details  Name: Stacey Duffy MRN: [id number] Date of Birth: 11/22/1945  Subjective/Objective:  History of diabetes mellitus type 2 but not taking any medications; Admitted for hypoglycemia              Action/Plan: Novolin 70/30 (vial) [facility] $25 at [facility].  No PCP noted, NCM provided indigent clinic information for patient to call on Monday (closed for Holiday).  Expected Discharge Date:     To be Determined             Expected Discharge Plan:  Home/Self Care  In-House Referral:  Nutrition  Discharge planning Services  CM Consult, Medication Assistance, Indigent Health Clinic  Status of Service:  In process, will continue to follow  If discussed at Long Length of Stay Meetings, dates discussed:    Additional Comments:  Yancey FlemingsKimberly R Johnhenry Tippin, RN 10/06/2017, 9:43 AM

## 2017-10-07 LAB — AEROBIC CULTURE W GRAM STAIN (SUPERFICIAL SPECIMEN): Gram Stain: NONE SEEN

## 2017-10-09 LAB — CULTURE, BLOOD (ROUTINE X 2)
CULTURE: NO GROWTH
Culture: NO GROWTH
SPECIAL REQUESTS: ADEQUATE
SPECIAL REQUESTS: ADEQUATE

## 2017-10-10 LAB — AEROBIC/ANAEROBIC CULTURE W GRAM STAIN (SURGICAL/DEEP WOUND)

## 2017-10-10 LAB — AEROBIC/ANAEROBIC CULTURE (SURGICAL/DEEP WOUND)

## 2017-10-27 ENCOUNTER — Ambulatory Visit (INDEPENDENT_AMBULATORY_CARE_PROVIDER_SITE_OTHER): Payer: Self-pay | Admitting: Podiatry

## 2017-10-27 ENCOUNTER — Encounter: Payer: Self-pay | Admitting: Podiatry

## 2017-10-27 ENCOUNTER — Ambulatory Visit (INDEPENDENT_AMBULATORY_CARE_PROVIDER_SITE_OTHER): Payer: Self-pay

## 2017-10-27 VITALS — BP 145/86 | Temp 97.2°F

## 2017-10-27 DIAGNOSIS — L97429 Non-pressure chronic ulcer of left heel and midfoot with unspecified severity: Secondary | ICD-10-CM

## 2017-10-27 NOTE — Progress Notes (Signed)
Subjective:   Patient ID: Stacey Duffy, female   DOB: 72 y.o.   MRN: [id number]   HPI Patient presents with chronic breakdown of tissue plantar aspect left distal heel that is been present for a long time and she does not speak any English and presents with son and daughter-in-law.  Patient has had long-term diabetes not in good control with last A1c being over 12   Review of Systems  All other systems reviewed and are negative.       Objective:  Physical Exam  Constitutional: She appears well-developed and well-nourished.  Cardiovascular: Intact distal pulses.  Pulmonary/Chest: Effort normal.  Musculoskeletal: Normal range of motion.  Neurological: She is alert.  Skin: Skin is warm.  Nursing note and vitals reviewed.   Neurovascular status found to be diminished with diminished pulses and diminished sharp dull vibratory bilateral.  Patient does have a relatively high arch foot structure and presents with a breakdown of tissue in the plantar aspect left foot measuring about 8 mm in width by 8 mm in length with 3 mm of depth with no current subcutaneous exposure.  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  They will do this at home and patient will be seen by Dr. Samuella Cota in 2 weeks and may need to consider either other possible wound care products or possible grafting at one point in future.  Patient will be reevaluated and was instructed if she develops any redness swelling or systemic signs of infection to go straight to the emergency room  X-rays were negative for signs of ostial lysis or bony pathology

## 2017-11-09 ENCOUNTER — Ambulatory Visit (INDEPENDENT_AMBULATORY_CARE_PROVIDER_SITE_OTHER): Payer: Self-pay | Admitting: Podiatry

## 2017-11-09 ENCOUNTER — Encounter: Payer: Self-pay | Admitting: Podiatry

## 2017-11-09 DIAGNOSIS — L97521 Non-pressure chronic ulcer of other part of left foot limited to breakdown of skin: Secondary | ICD-10-CM

## 2017-11-09 NOTE — Progress Notes (Signed)
  Subjective:  Patient ID: Stacey Duffy, female    DOB: June 25, 1945,  MRN: [id number]  Chief Complaint  Patient presents with  . Wound Check    Lt foot wound   72 y.o. female returns for wound care. States the sore started from stepping on a bonefish.  Referred for evaluation by Dr. Charlsie Merles  Objective:  There were no vitals filed for this visit. General AA&O x3. Normal mood and affect.  Vascular Foot warm to touch.  Neurologic Sensation grossly diminished.  Dermatologic (Wound) Wound Location: Left mid heel Wound Measurement: 1 x 0.5 Wound Base: Granular/Healthy Peri-wound: Calloused Exudate: None: wound tissue dry  Wound progress: No Change since last check.  Orthopedic: No pain to palpation either foot.   Assessment & Plan:  Patient was evaluated and treated and all questions answered.  Ulcer R Midfoot -XR reviewed. No underlying osseous prominence -Debridement as below. -Dressed with medihoney, DSD. -Would likely benefit from operative debridement with application skin graft substitute  Procedure: Excisional Debridement of Wound Rationale: Removal of non-viable soft tissue from the wound to promote healing.  Anesthesia: None Pre-Debridement Wound Measurements: 0.5 cm x 0.5 cm x 0.1 cm  Post-Debridement Wound Measurements: 1 cm x 0.5 cm x 0.1 cm  Type of Debridement: Excisional Tissue Removed: Non-viable soft tissue Depth of Debridement: subq Instrumentation: 312 plate  Technique: Sharp excisional debridement to bleeding, viable wound base.  Dressing: Dry, sterile, compression dressing. Disposition: Patient tolerated procedure well. Patient to return in 1 week for follow-up.  Return in about 2 weeks (around 11/23/2017) for Post-op, Wound Care.

## 2017-11-10 ENCOUNTER — Ambulatory Visit: Payer: Self-pay | Admitting: Podiatry

## 2017-11-23 ENCOUNTER — Encounter: Payer: Self-pay | Admitting: Podiatry

## 2019-12-27 IMAGING — MR MR FOOT*L* W/O CM
4 of 5 series · 24 of 40 positions shown · non-contrast
Comparison: None.

CLINICAL DATA: Left foot swelling. History diabetes. Ulcer along
the plantar aspect of the foot.

EXAM:
MRI OF THE LEFT FOOT WITHOUT CONTRAST
TECHNIQUE: Multiplanar, multisequence MR imaging of the left forefoot was
performed. No intravenous contrast was administered.

[Series 4: T1 · axial · 3.0mm · 0.31mm/px · z∈[-36,+100]mm · 8 of 40 slices shown (1 of 2)]
[im 1/40]
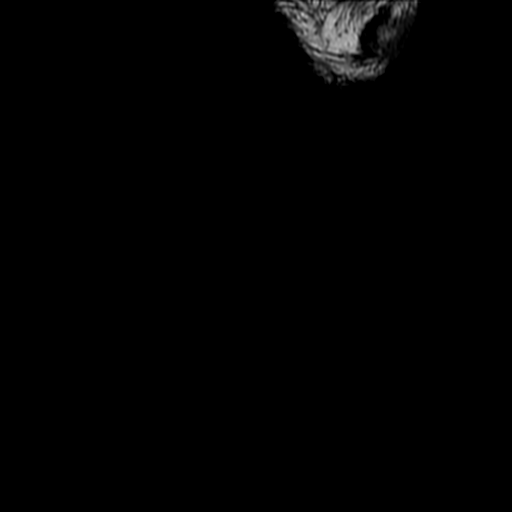
[im 6/40]
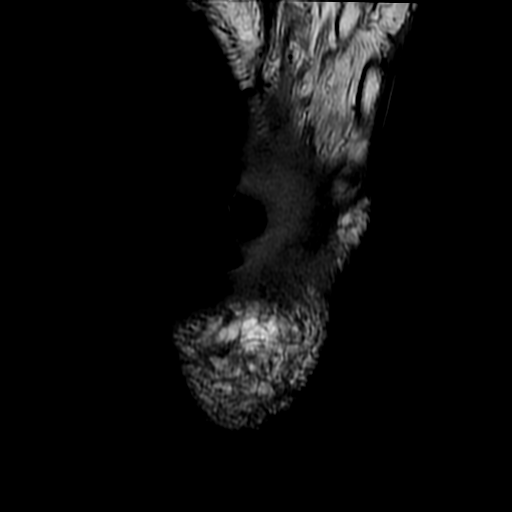
[im 12/40]
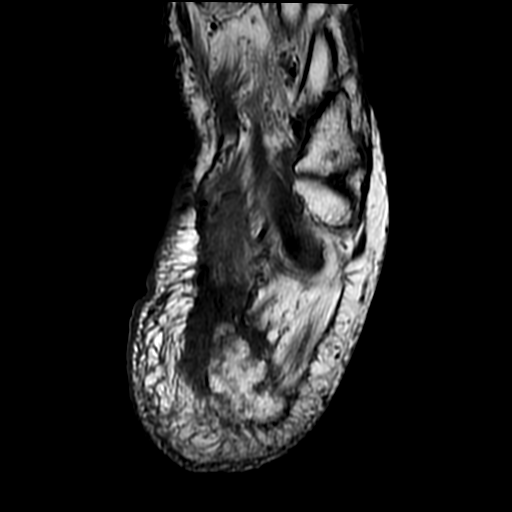
[im 17/40]
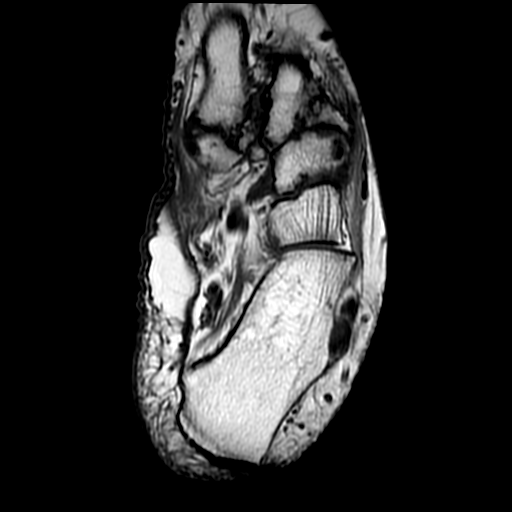
[im 23/40]
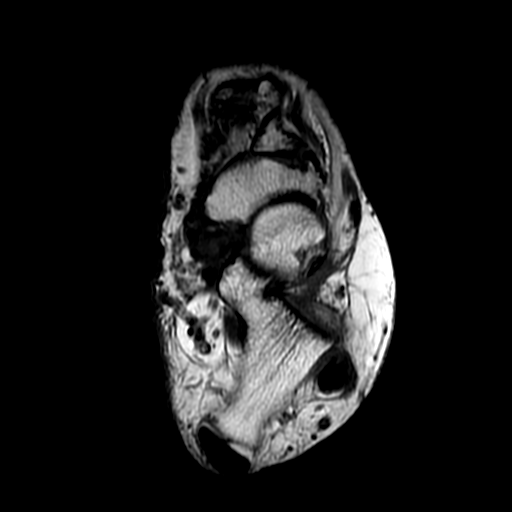
[im 28/40]
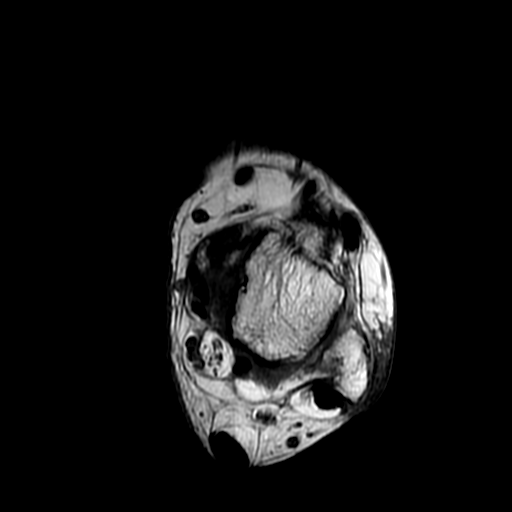
[im 34/40]
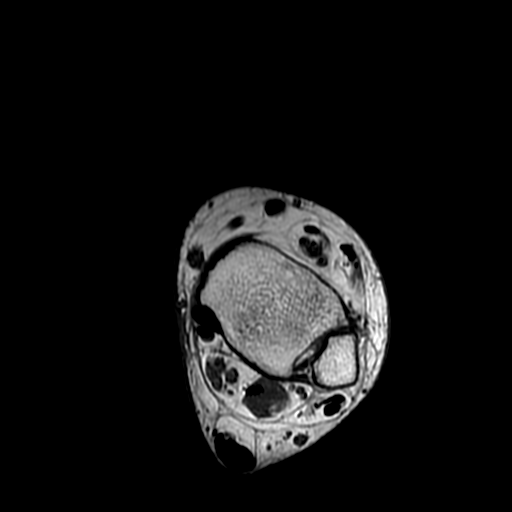
[im 40/40]
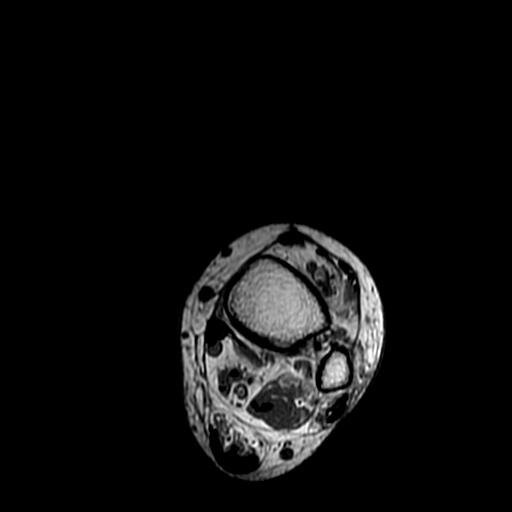

[Series 5: T2 fat-sat · axial · 3.0mm · 0.62mm/px · z∈[-36,+100]mm · 8 of 40 slices shown]
[im 1/40]
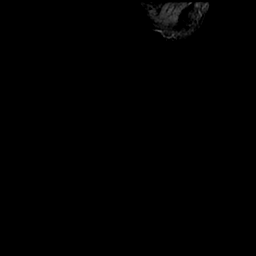
[im 6/40]
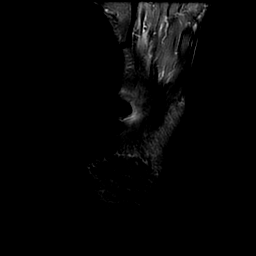
[im 12/40]
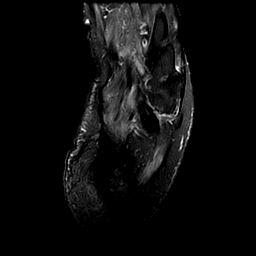
[im 17/40]
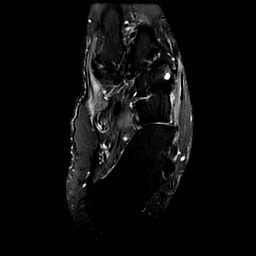
[im 23/40]
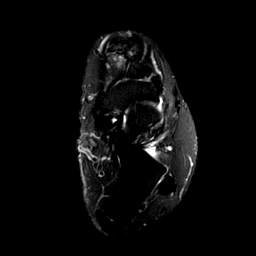
[im 28/40]
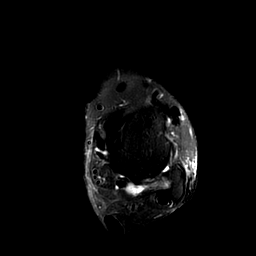
[im 34/40]
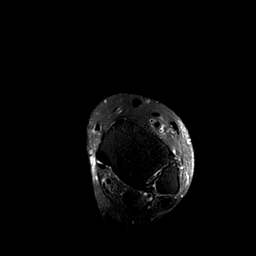
[im 40/40]
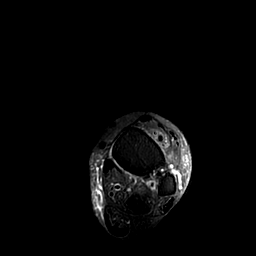

[Series 8: STIR · sagittal · 3.0mm · 0.31mm/px · 3 of 30 slices shown]
[im 6/30]
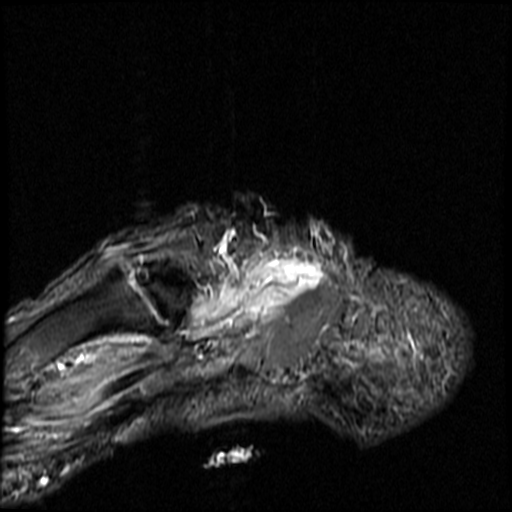
[im 18/30]
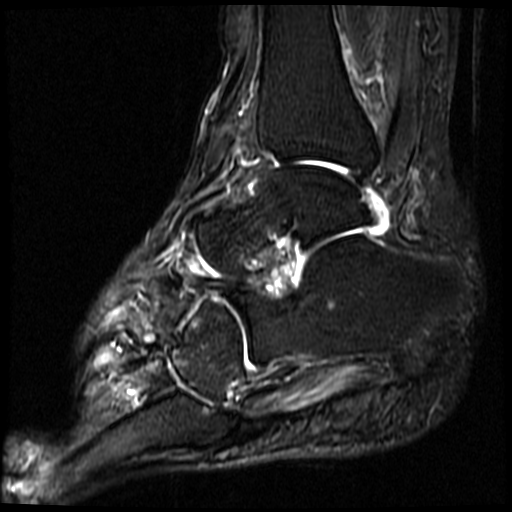
[im 30/30]
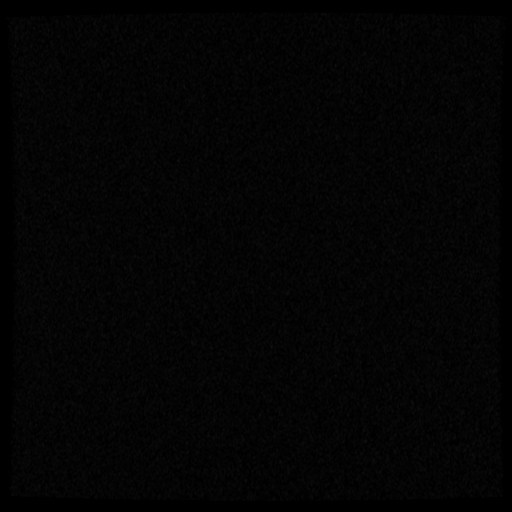

[Series 13: T1 · coronal · 3.0mm · 0.27mm/px · 5 of 41 slices shown (2 of 2)]
[im 1/41]
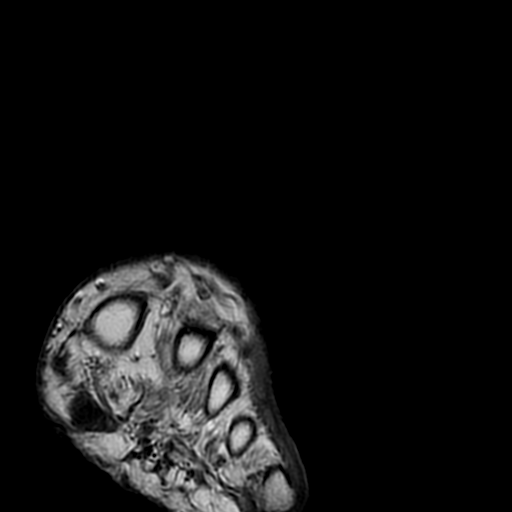
[im 6/41]
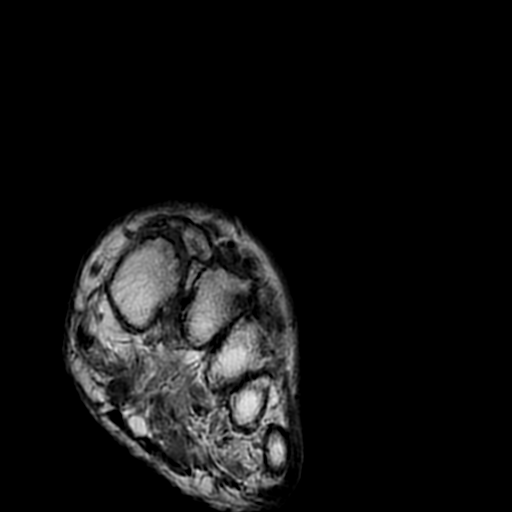
[im 11/41]
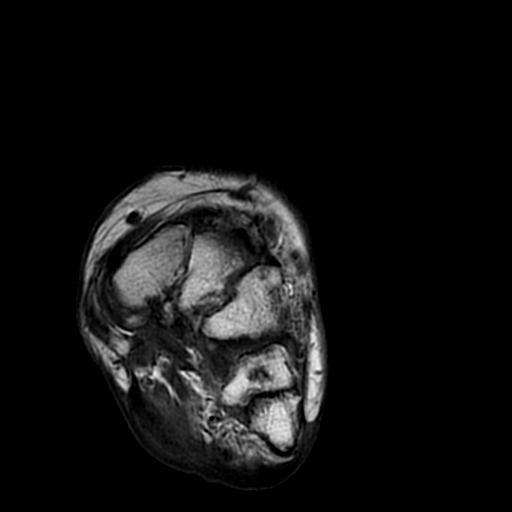
[im 21/41]
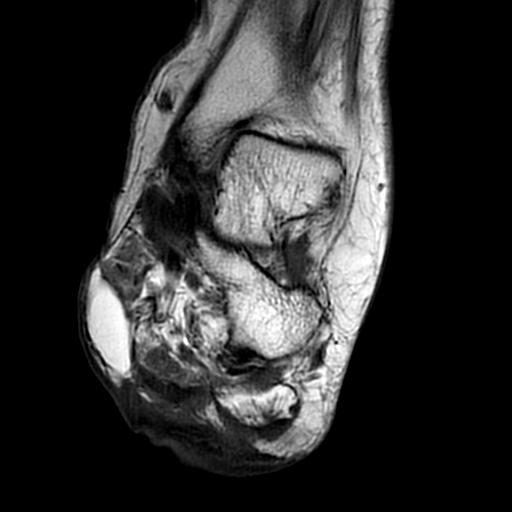
[im 36/41]
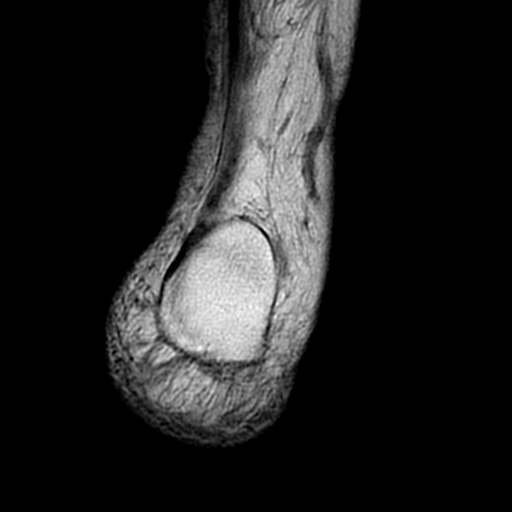

[24 of 40 positions shown; findings below may reference images not displayed]

FINDINGS: TENDONS

Peroneal: Peroneal longus tendon intact. Mild tendinosis of peroneus
brevis with a longitudinal split tear.

Posteromedial: Mild tendinosis of the posterior tibial tendon.
Flexor hallucis longus tendon intact. Flexor digitorum longus tendon
intact.

Anterior: Tibialis anterior tendon intact. Extensor hallucis longus
tendon intact Extensor digitorum longus tendon intact.

Achilles:  Intact.

Plantar Fascia: Thickening and increased signal in the medial band
of the plantar fascia at the calcaneal insertion consistent with
plantar fasciitis.

LIGAMENTS

Lateral: Anterior talofibular ligament intact. Calcaneofibular
ligament intact. Posterior talofibular ligament intact. Anterior and
posterior tibiofibular ligaments intact.

Medial: Deltoid ligament intact. Spring ligament intact.

CARTILAGE

Ankle Joint: No joint effusion. Partial-thickness cartilage loss of
the tibiotalar joint.

Subtalar Joints/Sinus Tarsi: Normal subtalar joints. No subtalar
joint effusion. Normal sinus tarsi.

Bones: No acute fracture or dislocation. Moderate osteoarthritis of
the first tarsometatarsal joint with subchondral marrow edema in the
medial cuneiform. Mild osteoarthritis of the second, third and
fourth tarsometatarsal joints. No acute fracture or dislocation. No
periosteal reaction or bone destruction.

Soft Tissue: Large soft tissue ulcer along the plantar medial aspect
of midfoot extending deep to the plantar fascia most consistent with
cellulitis. No focal fluid collection to suggest an abscess. Diffuse
atrophy of the plantar musculature.
IMPRESSION: 1. Large soft tissue ulcer along the plantar medial aspect of
midfoot extending deep to the plantar fascia most consistent with
cellulitis.
2. No osteomyelitis of the left foot.
3. Mild tendinosis of peroneus brevis with a longitudinal split
tear.
4. Mild tendinosis of the posterior tibial tendon.
5. Thickening and increased signal in the medial band of the plantar
fascia at the calcaneal insertion consistent with plantar fasciitis.
